# Patient Record
Sex: Female | Born: 1985 | Race: White | Hispanic: Yes | Marital: Single | Smoking: Never smoker
Health system: Southern US, Community
[De-identification: ages and names within clinical notes are randomized; demographics above are authoritative.]

## PROBLEM LIST (undated history)

## (undated) DIAGNOSIS — Z789 Other specified health status: Secondary | ICD-10-CM

## (undated) HISTORY — PX: HERNIA REPAIR: SHX51

---

## 2007-03-07 ENCOUNTER — Emergency Department (HOSPITAL_COMMUNITY): Admission: EM | Admit: 2007-03-07 | Discharge: 2007-03-07 | Payer: Self-pay | Admitting: Emergency Medicine

## 2008-06-23 ENCOUNTER — Ambulatory Visit (HOSPITAL_COMMUNITY): Admission: RE | Admit: 2008-06-23 | Discharge: 2008-06-23 | Payer: Self-pay | Admitting: Obstetrics & Gynecology

## 2008-07-21 ENCOUNTER — Ambulatory Visit: Payer: Self-pay | Admitting: Advanced Practice Midwife

## 2008-07-21 ENCOUNTER — Inpatient Hospital Stay (HOSPITAL_COMMUNITY): Admission: AD | Admit: 2008-07-21 | Discharge: 2008-07-23 | Payer: Self-pay | Admitting: Family Medicine

## 2009-01-02 IMAGING — CR DG ABDOMEN ACUTE W/ 1V CHEST
3 series · 3 of 3 positions shown · non-contrast
Comparison: None.

CLINICAL DATA: Fell ? abdominal pain.  
 ACUTE ABDOMEN SERIES WITH CHEST ? 3 VIEW:

[w chest pa]
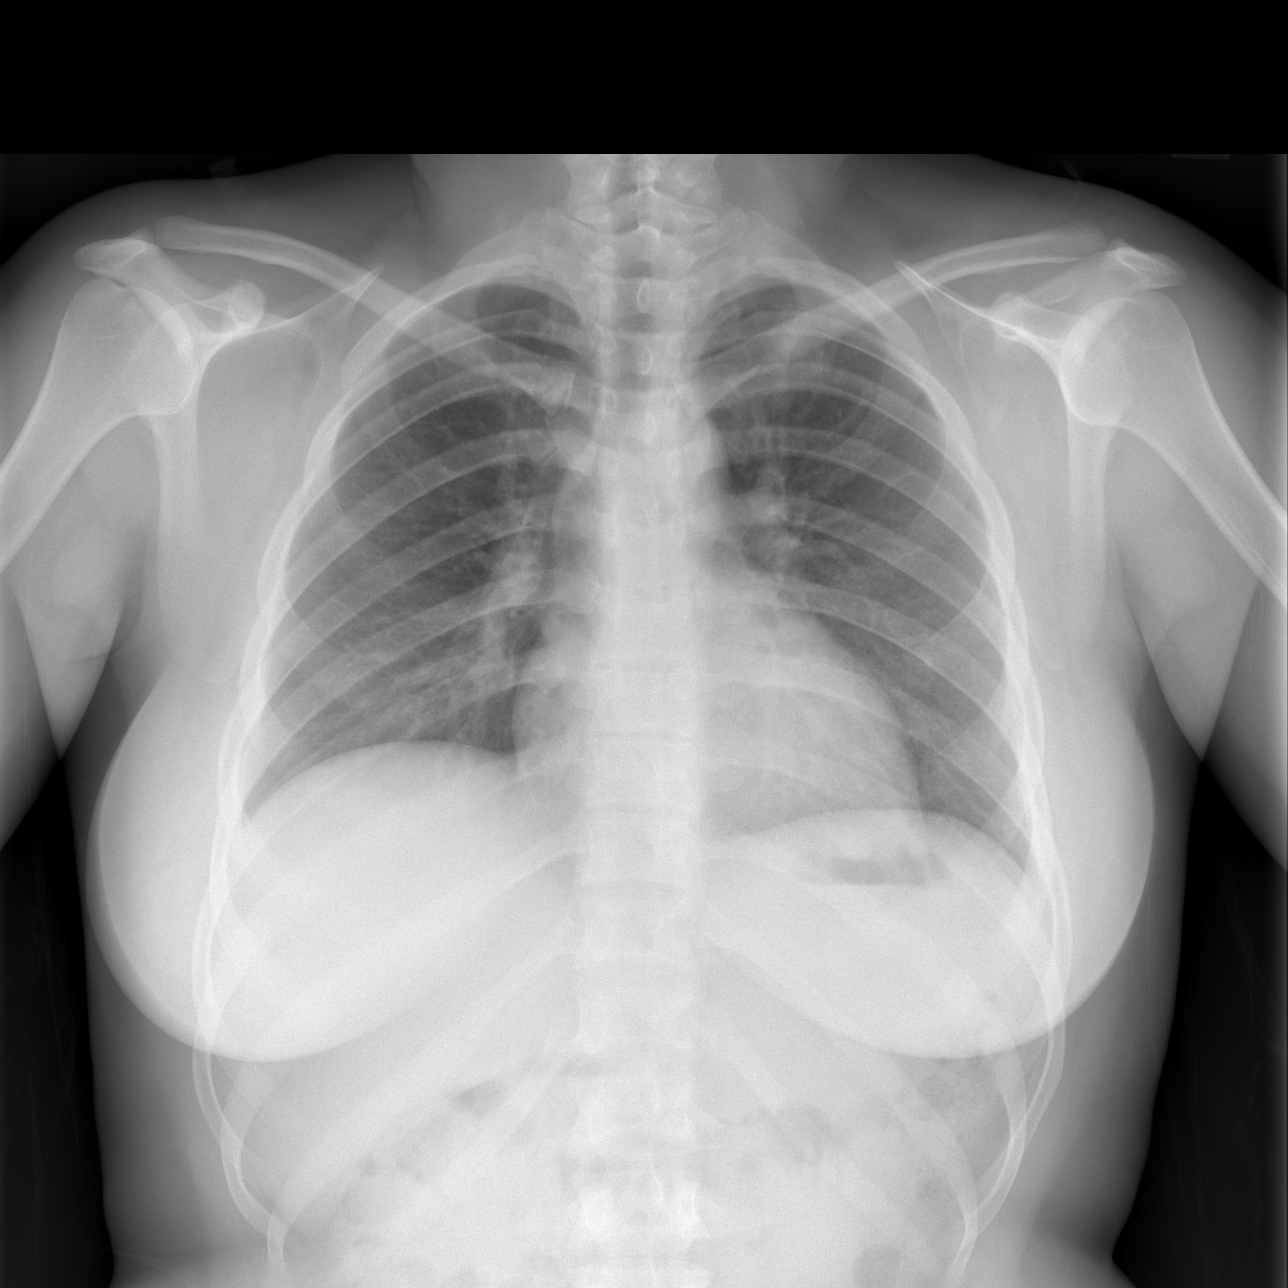

[w abdomen upright *]
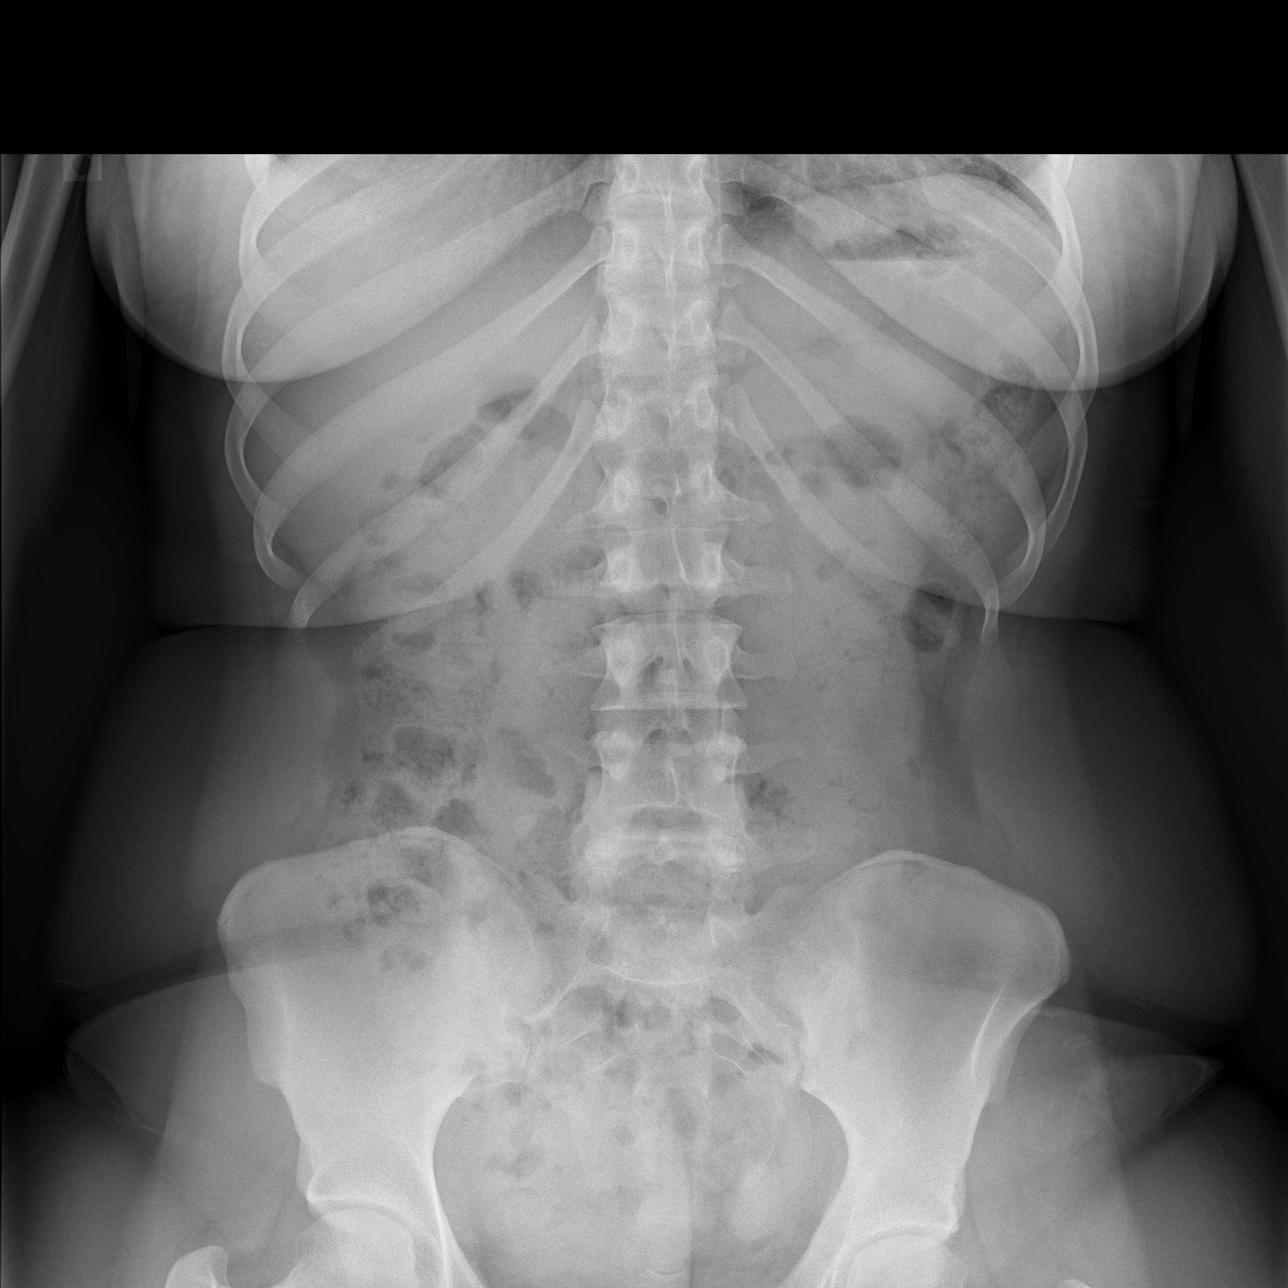

[t abdomen supine]
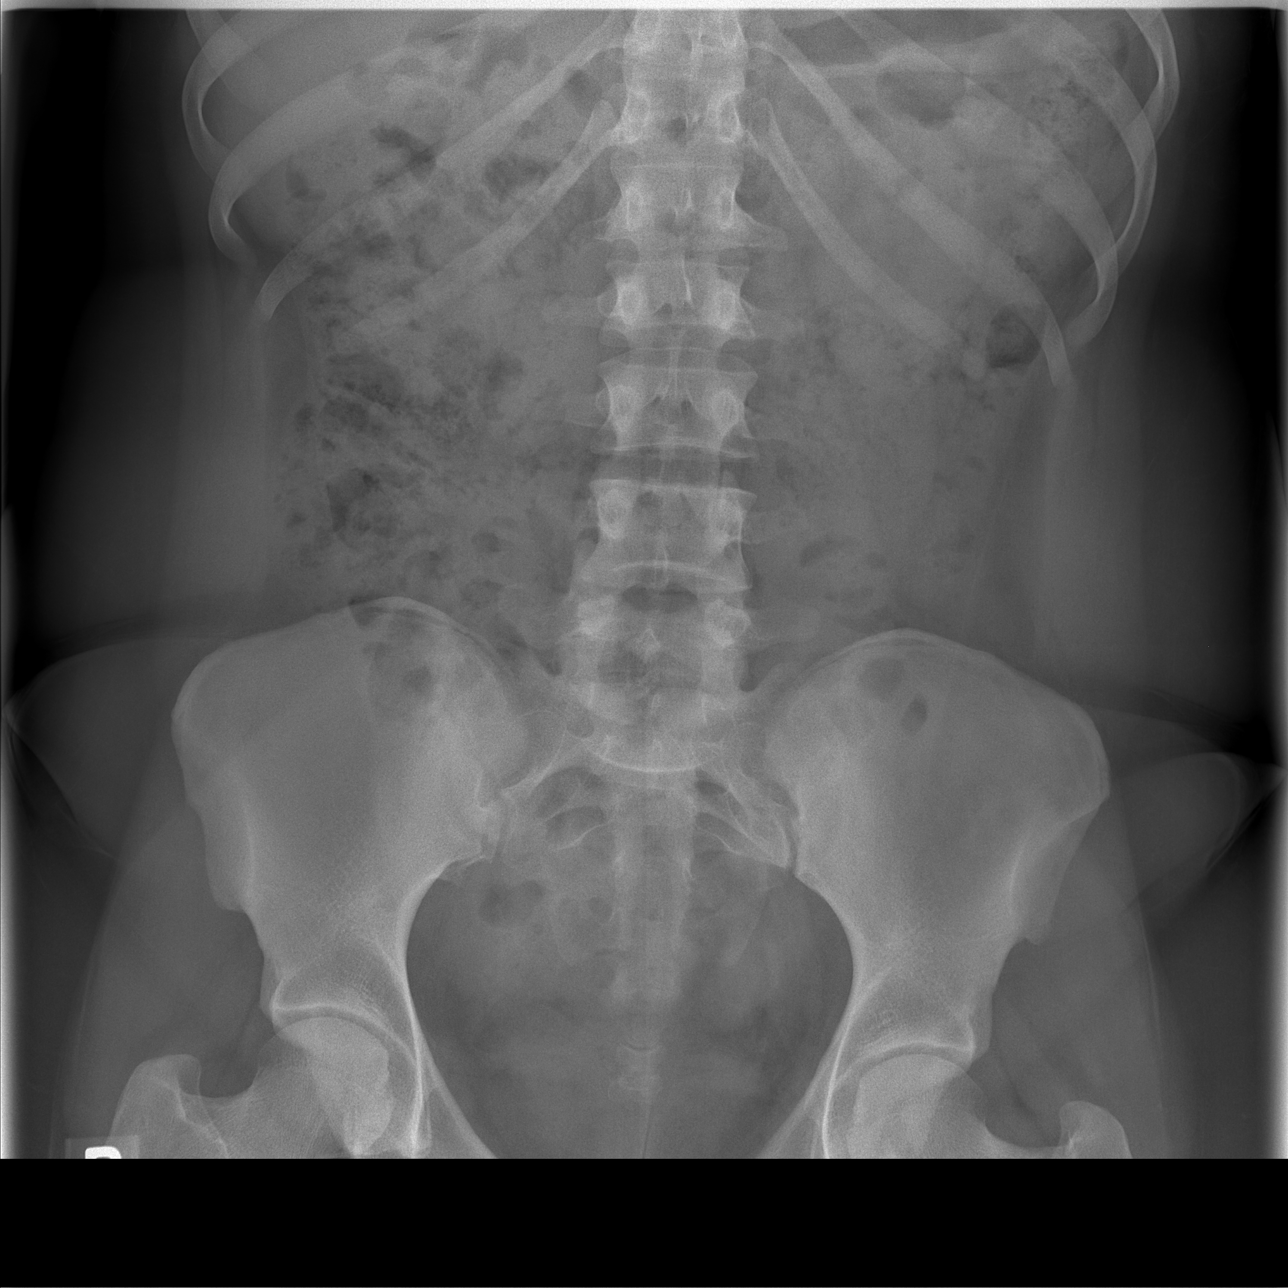

[3 of 3 positions shown; findings below may reference images not displayed]

FINDINGS: Relatively low level of inspiration but no active cardiopulmonary process in the AP projection.  
 No free air or acute/specific abnormality of the bowel gas pattern.  Psoas margins intact.  Bony structures are unremarkable.
IMPRESSION: No acute or significant findings.

## 2010-06-28 LAB — CBC
HCT: 37.4 % (ref 36.0–46.0)
Hemoglobin: 12.9 g/dL (ref 12.0–15.0)
MCV: 92.1 fL (ref 78.0–100.0)
Platelets: 204 10*3/uL (ref 150–400)
RDW: 12.5 % (ref 11.5–15.5)

## 2010-12-23 LAB — DIFFERENTIAL
Basophils Absolute: 0
Eosinophils Absolute: 0.3
Eosinophils Relative: 4
Lymphocytes Relative: 44
Lymphs Abs: 2.7
Monocytes Relative: 9
Neutro Abs: 2.6
Neutrophils Relative %: 42 — ABNORMAL LOW

## 2010-12-23 LAB — COMPREHENSIVE METABOLIC PANEL
Albumin: 3.8
Alkaline Phosphatase: 66
CO2: 26
Calcium: 8.9
Chloride: 105
Creatinine, Ser: 0.76
GFR calc non Af Amer: >60
Glucose, Bld: 89
Potassium: 3.7
Total Bilirubin: 0.8
Total Protein: 6.5

## 2010-12-23 LAB — CBC
MCV: 87.1
Platelets: 216
RDW: 12.4

## 2020-12-21 ENCOUNTER — Other Ambulatory Visit: Payer: Self-pay

## 2020-12-21 ENCOUNTER — Inpatient Hospital Stay (HOSPITAL_COMMUNITY)
Admission: AD | Admit: 2020-12-21 | Discharge: 2020-12-21 | Disposition: A | Discharge status: Home / Self Care | Payer: Self-pay | Attending: Obstetrics and Gynecology | Admitting: Obstetrics and Gynecology

## 2020-12-21 DIAGNOSIS — Z3202 Encounter for pregnancy test, result negative: Secondary | ICD-10-CM

## 2020-12-21 DIAGNOSIS — R1032 Left lower quadrant pain: Secondary | ICD-10-CM | POA: Insufficient documentation

## 2020-12-21 DIAGNOSIS — N939 Abnormal uterine and vaginal bleeding, unspecified: Secondary | ICD-10-CM | POA: Insufficient documentation

## 2020-12-21 LAB — POCT PREGNANCY, URINE: Preg Test, Ur: NEGATIVE

## 2020-12-21 NOTE — MAU Note (Signed)
Presents c/o VB, states bleeding was initially light and is now heavier like a menstrual cycle.  Reports VB began on Thursday and worsened today.  Also reports cramping in left lower quadrant.  LMP 11/14/2020.  +HPT.

## 2020-12-21 NOTE — MAU Provider Note (Signed)
Event Date/Time  First Provider Initiated Contact with Patient 12/21/20 1751     S [name] Meg Niemeier is a 35 y.o. [phone] patient who presents to MAU today with complaint of worsening vaginal bleeding 2/2 two positive home pregnancy tests. Patient states her bleeding began as light spotting last Thursday 12/16/2020. Over time her bleeding has intensified and now seems more like her menstrual cycle. She also c/o LLQ "crampy" pain. She is not saturating a pad. She is not experiencing associated symptoms like weakness, dizziness, syncope.  O BP 121/69 (BP Location: Right Arm)   Pulse 78   Temp 98.2 F (36.8 C) (Oral)   Resp 20   Wt 92.9 kg   LMP 11/14/2020   SpO2 100%    Physical Exam Vitals and nursing note reviewed. Exam conducted with a chaperone present.  Constitutional:      Appearance: Normal appearance.  Cardiovascular:     Rate and Rhythm: Normal rate.     Pulses: Normal pulses.  Pulmonary:     Effort: Pulmonary effort is normal.  Abdominal:     General: Abdomen is flat.  Neurological:     Mental Status: She is alert and oriented to person, place, and time.  Psychiatric:        Mood and Affect: Mood normal.        Behavior: Behavior normal.        Thought Content: Thought content normal.        Judgment: Judgment normal.   A Medical screening exam complete Negative urine pregnancy test in MAU Pt declined offer of Quant hCG due to her work schedule Reassured that given symptoms and positive HPT she can return to MAU overnight or tomorrow for Quant hCG  P Discharge from MAU in stable condition Bleeding precautions reviewed Patient may return to MAU as needed   Language barrier: in-person spanish interpreter Tim Lair present for all patient interaction  Mallie Snooks, [place] 12/21/2020 6:27 PM

## 2021-03-20 NOTE — L&D Delivery Note (Addendum)
Obstetrical Delivery Note   Date of Delivery:   11/06/2021 Primary OB:   [facility] Department Gestational Age/EDD: 26w1dReason for Admission: Preterm labor Antepartum complications: preterm advanced cervical dilation. Accessory lobe of the placenta  Delivered By:   CDurene RomansMd  Delivery Type:   spontaneous vaginal delivery  Delivery Details:   Called to see patient for feeling lots of pressure and patient felt complete. SVE done and pt complete and +3. NICU team called and patient had an uncomplicated delivery.    Placenta cord starting to avulse with delivery of the placenta. So manual extraction of the placenta done. Placenta removed in one piece and inspected and weak area was at the fibroid area, which the fibroid felt to be submucosal, near the fundus and to be about 10cm. I didn't feel any placenta left at the fundus but it was hard to get my hand all the way to the fundus due to the obstructing fibroid but I didn't feel anything on circumferential sweep with my fingers. Bleeding within normal limits. IV cefoxitin x 1 ordered due to manual extraction. Perineum inspected and no lacerations Anesthesia:    epidural Intrapartum complications: Preterm Labor GBS:    Negative but patient was still receiving PCN due to being preterm.  Laceration:    none Episiotomy:    none Rectal exam:   deferred Placenta:    Delivered and expressed via active management. Intact: yes. To pathology: yes.  Delayed Cord Clamping: Yes for 30 seconds.  Estimated Blood Loss:  5075m Baby:    Liveborn female, APGARs 7/9, weight 1390gm Cord gases Arterial pH 7.37, cO2 46, bicarb 23.4 Venous pH 7.42, cO2 36, bicard 26.6   I told her consideration should be done about removal of fibroid prior to next pregnancy  iPad interpreter present for delivery  ChDurene RomansD Attending [facility]

## 2021-08-25 LAB — HEPATITIS C ANTIBODY: HCV Ab: NEGATIVE

## 2021-08-25 LAB — OB RESULTS CONSOLE HIV ANTIBODY (ROUTINE TESTING): HIV: NONREACTIVE

## 2021-08-25 LAB — OB RESULTS CONSOLE HGB/HCT, BLOOD
HCT: 38 (ref 29–41)
Hemoglobin: 12.1

## 2021-08-25 LAB — OB RESULTS CONSOLE GC/CHLAMYDIA
Chlamydia: NEGATIVE
Neisseria Gonorrhea: NEGATIVE

## 2021-08-25 LAB — OB RESULTS CONSOLE HEPATITIS B SURFACE ANTIGEN: Hepatitis B Surface Ag: NEGATIVE

## 2021-08-25 LAB — OB RESULTS CONSOLE RPR: RPR: NONREACTIVE

## 2021-08-25 LAB — OB RESULTS CONSOLE RUBELLA ANTIBODY, IGM: Rubella: IMMUNE

## 2021-08-25 LAB — OB RESULTS CONSOLE VARICELLA ZOSTER ANTIBODY, IGG: Varicella: IMMUNE

## 2021-08-25 LAB — OB RESULTS CONSOLE PLATELET COUNT: Platelets: 231

## 2021-08-30 ENCOUNTER — Ambulatory Visit: Payer: Self-pay

## 2021-08-30 ENCOUNTER — Telehealth: Payer: Self-pay

## 2021-09-06 ENCOUNTER — Ambulatory Visit: Payer: Self-pay

## 2021-09-06 ENCOUNTER — Ambulatory Visit: Payer: Self-pay | Attending: Obstetrics and Gynecology | Admitting: Obstetrics and Gynecology

## 2021-09-06 DIAGNOSIS — Z8489 Family history of other specified conditions: Secondary | ICD-10-CM

## 2021-09-06 DIAGNOSIS — O09522 Supervision of elderly multigravida, second trimester: Secondary | ICD-10-CM

## 2021-09-06 DIAGNOSIS — Z3A21 21 weeks gestation of pregnancy: Secondary | ICD-10-CM

## 2021-09-06 NOTE — Progress Notes (Signed)
I connected with  Mary Levine on 09/06/21 by telephone and verified that I am speaking with the correct person using two identifiers.   I discussed the limitations of evaluation and management by telemedicine. The patient expressed understanding and agreed to proceed.   Referring Provider:   [facility] Department Length of Consultation: 60 minutes  [name] was referred to Maternal Fetal Care at [facility] for genetic counseling because of advanced maternal age.  The patient will be 36 years old at the time of delivery.  This note summarizes the information we discussed with the patient alone and with the aid of a Spanish interpreter.  The patient was counseled by myself and Mary Levine, genetic counseling intern.    We explained that the chance of a chromosome abnormality increases with maternal age.  Chromosomes and examples of chromosome problems were reviewed.  Humans typically have 46 chromosomes in each cell, with half passed through each sperm and egg.  Any change in the number or structure of chromosomes can increase the risk of problems in the physical and mental development of a pregnancy.   Based upon age of the patient and the current gestational age, the chance of any chromosome abnormality was 1 in 5. The chance of Down syndrome, the most common chromosome problem associated with maternal age, was 1 in 65.  The risk of chromosome problems is in addition to the 3% general population risk for birth defects and intellectual disabilities.  The greatest chance, of course, is that the baby would be born in good health.  We discussed the following prenatal screening and testing options for this pregnancy:  Cell free fetal DNA testing analyzes maternal blood to determine whether or not the baby may have Down syndrome, trisomy 56, or trisomy 46.  This test utilizes a maternal blood sample and DNA sequencing technology to isolate circulating cell free  fetal DNA from maternal plasma.  The fetal DNA can then be analyzed for DNA sequences that are derived from the three most common chromosomes involved in aneuploidy, chromosomes 13, 18, and 21.  If the overall amount of DNA is greater than the expected level for any of these chromosomes, aneuploidy is suspected.  While we do not consider it a replacement for invasive testing and karyotype analysis, a negative result from this testing would be reassuring, though not a guarantee of a normal chromosome complement for the baby.  An abnormal result is certainly suggestive of an abnormal chromosome complement, though we would still recommend CVS or amniocentesis to confirm any findings from this testing.  [name] had prior cell free fetal DNA testing in this pregnancy which was negative for Trisomy 13, 18 and 21.  Results were also low risk for sex chromosome aneuploidies, Triploidy and 22q11.2 deletion.  This result significantly reduces the chance for these conditions in the pregnancy, but is still considered a screening test.   Maternal serum marker screening, a blood test that measures pregnancy proteins, can provide risk assessments for Down syndrome, trisomy 18, and open neural tube defects (spina bifida, anencephaly). Because it does not directly examine the fetus, it cannot positively diagnose or rule out these problems. The detection rate is approximately 75% for Down syndrome, 70% for trisomy 18 and 80% of open neural tube defects. If prior chromosome screening has been performed, then AFP only is recommended to test for open neural tube defects alone.  Targeted ultrasound uses high frequency sound waves to create an  image of the developing fetus.  An ultrasound is often recommended as a routine means of evaluating the pregnancy.  It is also used to screen for fetal anatomy problems (for example, a heart defect) that might be suggestive of a chromosomal or other abnormality.   Amniocentesis involves  the removal of a small amount of amniotic fluid from the sac surrounding the fetus with the use of a thin needle inserted through the maternal abdomen and uterus.  Ultrasound guidance is used throughout the procedure.  Fetal cells from amniotic fluid are directly evaluated and > 99.5% of chromosome problems and > 98% of open neural tube defects can be detected. This procedure is generally performed after the 15th week of pregnancy.  The main risks to this procedure include complications leading to miscarriage in less than 1 in 200 cases (0.5%).  Prior carrier screening with the Horizon Basic panel was also negative.  This included screening for alpha thalassemia, beta hemoglobinopathies, cystic fibrosis and spinal muscular atrophy.  This negative testing greatly reduces, but cannot eliminate, the chance to have a child with any of these conditions.  See the lab report for details and residual risks.  We obtained a detailed family history and pregnancy history.   [name] is 35w2dand [phone] She reported some mild cramping. She reported bleeding (spotting) one time last week. [name] been experiencing vaginal discharge [facility] We recommended that she follow up with her provider since it is not improving. [name] taking low dose Aspirin daily as directed by her OB. She denied exposure to alcohol, cigarettes, and recreational drugs.  [name] a 110y.o. daughter from a previous relationship, and 36y.o. and 128y.o. daughters from another relationship. Her current pregnancy is the first with this partner (Mary Levine. She reports a potential very early miscarriage based on a home pregnancy test that was followed by bleeding; this was not clinically confirmed. She explained that her 190y.o. daughter had "blood clots" in her brain at 4 m.o. that required surgery. Doctors attributed the blood clots to a fall. Mrs. RKiki Bivenssaid that when her daughter fell, she does not believe that she hit her head. She reports that her daughter also had a large head (no hydrocephaly) and numerous fevers in childhood, with no seizures or developmental delay. We explained that there can be many genetic reasons for blood clots; without there being a known genetic cause, and with the history of potential trauma, it is difficult to determine risk for the current pregnancy. Of note, Ms. RMikal Levine's father had "blood clots" that were known to be secondary to a fall but that there is no other family history suggestive of a clotting disorder. The patient stated that she would try to bring medical records on her daughter's diagnosis and treatment to her next visit for our review.  Ms. RMikal Levine's 180y.o. daughter has eczema and learning challenges. We explained that learning differences are common and can have genetic or non-genetic causes. If an underlying genetic condition has been identified, there is often a known recurrence risk.  However, the absence of a known cause, most cases are expected to be multifactorial. The patient stated that this daughter had an evaluation for her learning delays, which she will try to bring to her next visit and we are happy to review. Ms. RMikal Levine's partner, ORica Levine has a nephew who is disabled  who she reported may have autism or Down syndrome. We would need more information to be able to assess the recurrence risk for the current pregnancy.  The remainder of the family history is unremarkable for birth defects, developmental delays, recurrent pregnancy loss or known chromosome abnormalities.  [name] was encouraged to call with questions or concerns.  We can be contacted at [phone].  Plan of Care: The patient declined amniocentesis and would like to have ultrasound only.  She is scheduled for her anatomy ultrasound at [facility] on 09/08/21. msAFP only to be drawn by OB if desired for spina  bifida screening. [name] stated that she would bring medical records from her daughters evaluations with her to the ultrasound visit on 09/08/21.  We have asked [facility] to forward those to our clinic for review if they are available.   Wilburt Finlay, MS, CGC

## 2021-09-22 ENCOUNTER — Telehealth: Payer: Self-pay | Admitting: Obstetrics and Gynecology

## 2021-09-22 NOTE — Telephone Encounter (Signed)
Phone call to Ms. Dejana Pugsley to review the medical records which she provided in follow up to her genetic counseling visit on 09/06/2021.  After multiple attempts, I was not able to reach the patient and her voicemail is full. The following is what we learned from a review of those records:  The patient had provided copies of medical records on two of her daughters in follow up to her genetic counseling visit.  This was in an effort to determine any possible genetic test results or diagnoses which may be helpful in providing recurrence risk assessments for the current pregnancy.  The records provided the following information:  Ignacia Bayley (dob 06/25/2010) was seen in 2012 at [facility] at 19 months old.  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  She also had ongoing fever at that time which was attributed to the hematoma and inflammation.  Additional imaging and testing, which included genetic testing (for glutaric aciduria and Menkes disease) as well as hematology evaluation (including factor VIII, vWF and hemoglobinopathy evaluation), were normal.  No underlying reason was found for the hematomas. Geraldo Pitter is now 36 years old and has no developmental concerns, other bleeding or clotting episodes or health problems.  The cause for her brain bleeding was attributed to a fall and no other medical cause was found to suggest an increased risk for bleeding or clotting in other family members.  Jocelyn Kathryne Gin was seen at [facility] in August of [birth year] at 36 years of age for evaluation of cognitive and emotional functioning. 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  The records also discuss emotional factors involved in her development.   While Verdie Drown has had a thorough evaluation, there is no mention of genetic testing to determine if there may be an underlying inherited cause for her condition. In the absence of a known cause, it is difficult to accurately provide a risk estimate for this pregnancy to have similar developmental differences. We could consider the option of carrier screening on Ms. Kambree Krauss for Fragile X Syndrome, an inherited condition which results in developmental delays, though it is important to remember that this is only one of many causes for developmental differences.  The records did not provide any specific diagnoses for which we would offer prenatal diagnosis or other carrier screening in this pregnancy.  We are happy to review records again in the future if additional information arises.  We may be reached at [phone] or [phone].  Wilburt Finlay, MS, CGC

## 2021-10-25 ENCOUNTER — Other Ambulatory Visit: Payer: Self-pay

## 2021-10-25 ENCOUNTER — Inpatient Hospital Stay (HOSPITAL_COMMUNITY)
Admission: AD | Admit: 2021-10-25 | Discharge: 2021-10-28 | DRG: 833 | Disposition: A | Discharge status: Home / Self Care | Payer: Medicaid Other | Attending: Obstetrics and Gynecology | Admitting: Obstetrics and Gynecology

## 2021-10-25 ENCOUNTER — Encounter (HOSPITAL_COMMUNITY): Payer: Self-pay | Admitting: *Deleted

## 2021-10-25 DIAGNOSIS — O3413 Maternal care for benign tumor of corpus uteri, third trimester: Secondary | ICD-10-CM | POA: Diagnosis present

## 2021-10-25 DIAGNOSIS — Z3A28 28 weeks gestation of pregnancy: Secondary | ICD-10-CM | POA: Diagnosis not present

## 2021-10-25 DIAGNOSIS — D259 Leiomyoma of uterus, unspecified: Secondary | ICD-10-CM | POA: Diagnosis present

## 2021-10-25 DIAGNOSIS — O09523 Supervision of elderly multigravida, third trimester: Secondary | ICD-10-CM

## 2021-10-25 DIAGNOSIS — O4700 False labor before 37 completed weeks of gestation, unspecified trimester: Principal | ICD-10-CM | POA: Diagnosis present

## 2021-10-25 LAB — TYPE AND SCREEN
ABO/RH(D): O POS
Antibody Screen: NEGATIVE

## 2021-10-25 LAB — URINALYSIS, ROUTINE W REFLEX MICROSCOPIC
Bacteria, UA: NONE SEEN
Bilirubin Urine: NEGATIVE
Glucose, UA: NEGATIVE mg/dL
Ketones, ur: 5 mg/dL — AB
Nitrite: NEGATIVE
Protein, ur: NEGATIVE mg/dL
Specific Gravity, Urine: 1.001 — ABNORMAL LOW (ref 1.005–1.030)
pH: 7 (ref 5.0–8.0)

## 2021-10-25 LAB — WET PREP, GENITAL
Clue Cells Wet Prep HPF POC: NONE SEEN
Sperm: NONE SEEN
Trich, Wet Prep: NONE SEEN
WBC, Wet Prep HPF POC: >=10 — AB (ref <10)
Yeast Wet Prep HPF POC: NONE SEEN

## 2021-10-25 LAB — CBC
HCT: 38.1 % (ref 36.0–46.0)
Hemoglobin: 13.1 g/dL (ref 12.0–15.0)
MCH: 30.3 pg (ref 26.0–34.0)
MCHC: 34.4 g/dL (ref 30.0–36.0)
MCV: 88.2 fL (ref 80.0–100.0)
Platelets: 254 10*3/uL (ref 150–400)
RBC: 4.32 MIL/uL (ref 3.87–5.11)
RDW: 12.5 % (ref 11.5–15.5)
WBC: 10 10*3/uL (ref 4.0–10.5)
nRBC: 0 % (ref 0.0–0.2)

## 2021-10-25 MED ORDER — ACETAMINOPHEN 325 MG PO TABS
650.0000 mg | ORAL_TABLET | Freq: Once | ORAL | Status: AC
  Administered 2021-10-25: 650 mg via ORAL

## 2021-10-25 MED ORDER — PRENATAL MULTIVITAMIN CH
1.0000 | ORAL_TABLET | Freq: Every day | ORAL | Status: DC
  Administered 2021-10-27: 1 via ORAL
  Filled 2021-10-25 (×2): qty 1

## 2021-10-25 MED ORDER — OXYTOCIN-SODIUM CHLORIDE 30-0.9 UT/500ML-% IV SOLN: 2.5000 [IU]/h | INTRAVENOUS | Status: DC

## 2021-10-25 MED ORDER — ACETAMINOPHEN 325 MG PO TABS
ORAL_TABLET | ORAL | Status: AC
  Filled 2021-10-25: qty 2

## 2021-10-25 MED ORDER — ZOLPIDEM TARTRATE 5 MG PO TABS: 5.0000 mg | ORAL_TABLET | Freq: Every evening | ORAL | Status: DC | PRN

## 2021-10-25 MED ORDER — LIDOCAINE HCL (PF) 1 % IJ SOLN: 30.0000 mL | INTRAMUSCULAR | Status: DC | PRN

## 2021-10-25 MED ORDER — MAGNESIUM SULFATE BOLUS VIA INFUSION
4.0000 g | Freq: Once | INTRAVENOUS | Status: AC
Start: 2021-10-25 — End: 2021-10-25
  Administered 2021-10-25: 4 g via INTRAVENOUS
  Filled 2021-10-25: qty 1000

## 2021-10-25 MED ORDER — ACETAMINOPHEN 325 MG PO TABS
650.0000 mg | ORAL_TABLET | ORAL | Status: DC | PRN
  Administered 2021-10-26: 650 mg via ORAL
  Filled 2021-10-25: qty 2

## 2021-10-25 MED ORDER — CALCIUM CARBONATE ANTACID 500 MG PO CHEW: 2.0000 | CHEWABLE_TABLET | ORAL | Status: DC | PRN

## 2021-10-25 MED ORDER — MAGNESIUM SULFATE 40 GM/1000ML IV SOLN
2.0000 g/h | INTRAVENOUS | Status: DC
  Administered 2021-10-26: 2 g/h via INTRAVENOUS
  Filled 2021-10-25 (×2): qty 1000

## 2021-10-25 MED ORDER — BETAMETHASONE SOD PHOS & ACET 6 (3-3) MG/ML IJ SUSP: 12.0000 mg | Freq: Once | INTRAMUSCULAR | Status: DC

## 2021-10-25 MED ORDER — LACTATED RINGERS IV BOLUS
1000.0000 mL | Freq: Once | INTRAVENOUS | Status: AC
  Administered 2021-10-25: 1000 mL via INTRAVENOUS

## 2021-10-25 MED ORDER — SOD CITRATE-CITRIC ACID 500-334 MG/5ML PO SOLN: 30.0000 mL | ORAL | Status: DC | PRN

## 2021-10-25 MED ORDER — LACTATED RINGERS IV SOLN: INTRAVENOUS | Status: DC

## 2021-10-25 MED ORDER — INDOMETHACIN 25 MG PO CAPS
100.0000 mg | ORAL_CAPSULE | ORAL | Status: AC
  Administered 2021-10-25: 100 mg via ORAL
  Filled 2021-10-25: qty 4

## 2021-10-25 MED ORDER — BETAMETHASONE SOD PHOS & ACET 6 (3-3) MG/ML IJ SUSP
12.0000 mg | INTRAMUSCULAR | Status: AC
  Administered 2021-10-25 – 2021-10-26 (×2): 12 mg via INTRAMUSCULAR
  Filled 2021-10-25 (×2): qty 5

## 2021-10-25 MED ORDER — INDOMETHACIN 25 MG PO CAPS
25.0000 mg | ORAL_CAPSULE | Freq: Four times a day (QID) | ORAL | Status: DC
  Administered 2021-10-25 – 2021-10-28 (×10): 25 mg via ORAL
  Filled 2021-10-25 (×14): qty 1

## 2021-10-25 MED ORDER — DOCUSATE SODIUM 100 MG PO CAPS
100.0000 mg | ORAL_CAPSULE | Freq: Every day | ORAL | Status: DC
Start: 2021-10-25 — End: 2021-10-28
  Administered 2021-10-27 – 2021-10-28 (×2): 100 mg via ORAL
  Filled 2021-10-25 (×2): qty 1

## 2021-10-25 MED ORDER — NIFEDIPINE 10 MG PO CAPS
10.0000 mg | ORAL_CAPSULE | ORAL | Status: DC | PRN
  Administered 2021-10-25: 10 mg via ORAL
  Filled 2021-10-25 (×2): qty 1

## 2021-10-25 MED ORDER — PENICILLIN G POT IN DEXTROSE 60000 UNIT/ML IV SOLN
3.0000 10*6.[IU] | INTRAVENOUS | Status: DC
  Administered 2021-10-26 (×4): 3 10*6.[IU] via INTRAVENOUS
  Filled 2021-10-25 (×9): qty 50

## 2021-10-25 MED ORDER — ONDANSETRON HCL 4 MG/2ML IJ SOLN: 4.0000 mg | Freq: Four times a day (QID) | INTRAMUSCULAR | Status: DC | PRN

## 2021-10-25 MED ORDER — OXYTOCIN BOLUS FROM INFUSION: 333.0000 mL | Freq: Once | INTRAVENOUS | Status: DC

## 2021-10-25 MED ORDER — SODIUM CHLORIDE 0.9 % IV SOLN
5.0000 10*6.[IU] | Freq: Once | INTRAVENOUS | Status: AC
  Administered 2021-10-25: 5 10*6.[IU] via INTRAVENOUS
  Filled 2021-10-25: qty 5

## 2021-10-25 MED ORDER — FENTANYL CITRATE (PF) 100 MCG/2ML IJ SOLN
50.0000 ug | INTRAMUSCULAR | Status: DC | PRN
  Administered 2021-10-25: 100 ug via INTRAVENOUS
  Filled 2021-10-25: qty 2

## 2021-10-25 NOTE — H&P (Signed)
Mary Levine is a 36 y.o. female [phone] with IUP at 25w3dby certain LMP  presenting for contractions. Patient endorses that she started having contractions "all of a sudden" starting last night that continued into today. She denies any history of pre-term labor.  She denies any recent illness. She denies any c/section in the past.  She reports positive fetal movement. She denies leakage of fluid or vaginal bleeding.  Pren[facility] History/Complications[facility] She passed her 2 hour GTT. Pregnancy has been complicated by anterior placenta with fundal fibroid measuring 9.8 cm; per MFM, no further work-up necessary.   Pregnancy complications:  - Past Medical History: History reviewed. No pertinent past medical history.  Past Surgical History: History reviewed. No pertinent surgical history.  Obstetrical History: OB History     Gravida  4   Para  3   Term  3   Preterm      AB      Living  3      SAB      IAB      Ectopic      Multiple      Live Births  3            Social History: Social History   Socioeconomic History   Marital status: Single    Spouse name: Not on file   Number of children: Not on file   Years of education: Not on file   Highest education level: Not on file  Occupational History   Not on file  Tobacco Use   Smoking status: Never   Smokeless tobacco: Never  Vaping Use   Vaping Use: Never used  Substance and Sexual Activity   Alcohol use: Never   Drug use: Never   Sexual activity: Yes  Other Topics Concern   Not on file  Social History Narrative   Not on file   Social Determinants of Health   Financial Resource Strain: Not on file  Food Insecurity: Not on file  Transportation Needs: Not on file  Physical Activity: Not on file  Stress: Not on file  Social Connections: Not on file    Family History: History reviewed. No pertinent family history.  Allergies: No Known Allergies  Medications Prior to Admission   Medication Sig Dispense Refill Last Dose   aspirin EC 81 MG tablet Take 81 mg by mouth daily. Swallow whole.   10/24/2021    Review of Systems   Constitutional: Negative for fever and chills Eyes: Negative for visual disturbances Respiratory: Negative for shortness of breath, dyspnea Cardiovascular: Negative for chest pain or palpitations  Gastrointestinal: Negative for vomiting, diarrhea and constipation.  POSITIVE for abdominal pain (contractions) Genitourinary: Negative for dysuria and urgency Musculoskeletal: Negative for back pain, joint pain, myalgias  Neurological: Negative for dizziness and headaches  Blood pressure 112/73, pulse 89, temperature 98.9 F (37.2 C), temperature source Oral, resp. rate 20, last menstrual period 11/14/2020, SpO2 99 %. General appearance: alert, cooperative, fatigued, flushed, and moderate distress Lungs: normal respiratory effort Heart: regular rate and rhythm Abdomen: soft, non-tender; bowel sounds normal Extremities: Homans sign is negative, no sign of DVT DTR's 2+ Presentation: cephalic Fetal monitoring   135 bpm, mod var, present acel, no decels, patient feels strong contractions that are occasionally picked up on toco Uterine activity   Dilation: 4 Exam by:: K.Jarrell Armond CNM   Prenatal labs: ABO, Rh: --/--/O POS (08/08 1712) Antibody: NEG (08/08 1712) Rubella:   RPR:    HBsAg:  HIV:    GBS:    1 hr Glucola: 117 Genetic screening   Anatomy US normal  Prenatal Transfer Tool  Maternal Diabetes: No Genetic Screening: Normal per patient Maternal Ultrasounds/Referrals: Other: Fetal Ultrasounds or other Referrals:  Referred to Materal Fetal Medicine  Maternal Substance Abuse:  No Significant Maternal Medications:  None Significant Maternal Lab Results: None  Results for orders placed or performed during the hospital encounter of 10/25/21 (from the past 24 hour(s))  Type and screen [place]   Collection Time:  10/25/21  5:12 PM  Result Value Ref Range   ABO/RH(D) O POS    Antibody Screen NEG    Sample Expiration      10/28/2021,2359 Performed at [facility], [place] [address], [place], [place] [address]   CBC   Collection Time: 10/25/21  5:13 PM  Result Value Ref Range   WBC 10.0 4.0 - 10.5 K/uL   RBC 4.32 3.87 - 5.11 MIL/uL   Hemoglobin 13.1 12.0 - 15.0 g/dL   HCT 38.1 36.0 - 46.0 %   MCV 88.2 80.0 - 100.0 fL   MCH 30.3 26.0 - 34.0 pg   MCHC 34.4 30.0 - 36.0 g/dL   RDW 12.5 11.5 - 15.5 %   Platelets 254 150 - 400 K/uL   nRBC 0.0 0.0 - 0.2 %  Urinalysis, Routine w reflex microscopic Urine, Clean Catch   Collection Time: 10/25/21  6:48 PM  Result Value Ref Range   Color, Urine COLORLESS (A) YELLOW   APPearance CLEAR CLEAR   Specific Gravity, Urine 1.001 (L) 1.005 - 1.030   pH 7.0 5.0 - 8.0   Glucose, UA NEGATIVE NEGATIVE mg/dL   Hgb urine dipstick SMALL (A) NEGATIVE   Bilirubin Urine NEGATIVE NEGATIVE   Ketones, ur 5 (A) NEGATIVE mg/dL   Protein, ur NEGATIVE NEGATIVE mg/dL   Nitrite NEGATIVE NEGATIVE   Leukocytes,Ua TRACE (A) NEGATIVE   RBC / HPF 0-5 0 - 5 RBC/hpf   WBC, UA 0-5 0 - 5 WBC/hpf   Bacteria, UA NONE SEEN NONE SEEN   Squamous Epithelial / LPF 0-5 0 - 5  Wet prep, genital   Collection Time: 10/25/21  7:40 PM  Result Value Ref Range   Yeast Wet Prep HPF POC NONE SEEN NONE SEEN   Trich, Wet Prep NONE SEEN NONE SEEN   Clue Cells Wet Prep HPF POC NONE SEEN NONE SEEN   WBC, Wet Prep HPF POC >=10 (A) <10   Sperm NONE SEEN     Assessment: Mary Levine is a 36 y.o. [phone] with an IUP at 36w3dpresenting for concern for SOL and making cervical change while in MAU.   Plan: #Labor: expectant management #Pain:  Per request #FWB Cat 1 #ID: GBS: pending  #MOF:  bottle #MOC: BTL (does not have medicaid and has not paid upfront cost) #Circ: NA (female)   KStarr Lake8/10/2021, 8:47 PM

## 2021-10-25 NOTE — Progress Notes (Signed)
Checked in on patient and introduced myself.  She is doing well. She states that her contractions have spaced out and become much milder. She has no other concerns.  Pregnancy was uncomplicated. No previous history of preterm deliveries.  EFM:

## 2021-10-25 NOTE — MAU Note (Addendum)
Mary Levine is a 36 y.o. at 68w3dhere in MAU reporting: she began having ctxs last night, but upon waking this morning they have worsened.  Reports ctxs are now 5 minutes apart with increasing intensity.  Denies LOF or VB, has clear/white vaginal discharge.  Endorses +FM.  Onset of complaint: last night Pain score: 8 Vitals:   10/25/21 1733 10/25/21 1745  BP: (!) 104/52 (!) 101/46  Pulse: 81   Resp:    Temp:    SpO2:       FHT:see flowsheet Lab orders placed from triage:   None

## 2021-10-25 NOTE — MAU Provider Note (Signed)
Patient Mary Levine is a 36 y.o.  [phone]  At 12w3dhere with complaints of abdominal pain that started last night and continued into this morning. She denies VB, LOF, decreased fetal movements. She denies any complications in this pregnancy; [facility]     CSN: [id number] Arrival date and time: 10/25/21 1637   None     Chief Complaint  Patient presents with   Contractions   HPI Patient 36y.o. female presents with abdominal pain. Onset of symptoms was gradual starting  last night.  They have been   gradually worsening course since that time. The pain is located in the RLQ and in the LLQ without radiation. Patient describes the pain as cramping,  severe. Pain has been associated with nothing. She has had normal BM and normal urinary output today. . Patient denies change in bowel habits, constipation, diarrhea, dyspepsia, jaundice, nausea, and vomiting. Symptoms are aggravated by none. Symptoms improve with none. Past history includes none.  OB History     Gravida  4   Para  3   Term  3   Preterm      AB      Living  3      SAB      IAB      Ectopic      Multiple      Live Births  3           History reviewed. No pertinent past medical history.  History reviewed. No pertinent surgical history.  History reviewed. No pertinent family history.  Social History   Tobacco Use   Smoking status: Never   Smokeless tobacco: Never  Vaping Use   Vaping Use: Never used  Substance Use Topics   Alcohol use: Never   Drug use: Never    Allergies: No Known Allergies  Medications Prior to Admission  Medication Sig Dispense Refill Last Dose   aspirin EC 81 MG tablet Take 81 mg by mouth daily. Swallow whole.   10/24/2021    Review of Systems  Constitutional: Negative.   HENT: Negative.    Respiratory: Negative.    Gastrointestinal:  Positive for abdominal pain.  Genitourinary:  Negative for vaginal bleeding,  vaginal discharge and vaginal pain.  Neurological: Negative.   Hematological: Negative.   Psychiatric/Behavioral: Negative.     Physical Exam   Blood pressure 120/70, pulse 80, temperature 98.1 F (36.7 C), temperature source Oral, resp. rate 16, last menstrual period 11/14/2020, SpO2 99 %.  Physical Exam Constitutional:      Appearance: Normal appearance.  Cardiovascular:     Rate and Rhythm: Normal rate.  Pulmonary:     Effort: Pulmonary effort is normal.  Abdominal:     General: Abdomen is flat.  Musculoskeletal:        General: Normal range of motion.  Skin:    General: Skin is warm.     Capillary Refill: Capillary refill takes less than 2 seconds.  Neurological:     General: No focal deficit present.     Mental Status: She is alert.  Psychiatric:     Comments: distressed     MAU Course  Procedures  MDM -called into room by RN as patient seemed like she was in active labor; initial exam was 3 cm. Over time she has made change to 4 cm and cervix feels like it is softening and moving more anterior.  She did not respond  to 1 dose of procardia or indomethacin; after discussion with Dr. Damita Dunnings, decision was made to admit to L and D and observe.   Assessment and Plan  -Admit to L and D; Dr. Damita Dunnings to place orders.   Mary Levine Mary Levine 10/25/2021, 9:45 PM

## 2021-10-25 NOTE — Consult Note (Signed)
Neonatology Consult Note:  At the request of the patients obstetrician Dr. Damita Dunnings I met with Mary Levine who is a 36 y.o. [phone] presenting with preterm labor.  She denies any complications in this pregnancy and has been receiving her prenatal care at [facility].  She is being managed with BMZ 10/25/21, Mag for neuroprotection and Indomethacin for tocolysis.    Using a video interpreter we discussed morbidity/mortality at this gestional age, delivery room resuscitation, including intubation and surfactant in DR.  Discussed risk  of IVH with potential for motor / cognitive deficits, NEC (and benefits of MBM in reducing incidence of NEC.  Discussed NG / OG feeds. Discussed likely length of stay.  Thank you for allowing [place] to participate in her care.  Please call with questions.  Higinio Roger, DO  Neonatologist  The total length of face-to-face or floor / unit time for this encounter was 35 minutes.  Counseling and / or coordination of care was greater than fifty percent of the time.

## 2021-10-26 ENCOUNTER — Inpatient Hospital Stay (HOSPITAL_COMMUNITY): Payer: Medicaid Other

## 2021-10-26 LAB — RAPID URINE DRUG SCREEN, HOSP PERFORMED
Amphetamines: NOT DETECTED
Barbiturates: NOT DETECTED
Benzodiazepines: NOT DETECTED
Cocaine: NOT DETECTED
Opiates: NOT DETECTED
Tetrahydrocannabinol: NOT DETECTED

## 2021-10-26 LAB — RPR: RPR Ser Ql: NONREACTIVE

## 2021-10-26 LAB — GC/CHLAMYDIA PROBE AMP (~~LOC~~) NOT AT ARMC
Chlamydia: NEGATIVE
Comment: NEGATIVE
Comment: NORMAL
Neisseria Gonorrhea: NEGATIVE

## 2021-10-26 MED ORDER — LACTATED RINGERS IV SOLN: INTRAVENOUS | Status: DC

## 2021-10-26 NOTE — Progress Notes (Signed)
Patient seen.  No new concerns, sleeping comfortably.  Contractions have waned completely at this time, Cervix: 4cm / 50% / ballotable EFM: 140 bpm, moderate variability / +accels, no decels No contractions noted on tocometry  Ass: Preterm contractions, s/p tocolytics, 1 dose of Betamethasone, now on indomethacin and Mg sulf. Contractions have waned and no cervical change noted.  FWB: category 1  Plan: ct monitoring, discussed findings with patient. Anticipate admission till 2nd dose of BMX

## 2021-10-26 NOTE — Progress Notes (Signed)
Labor Progress Note Addysin M Laddie Naeem is a 36 y.o. G4P3003 at 57w4dpresented for threatened PTL.   S: Resting. Doing well. No concerns at this time.   O:  BP 109/65   Pulse 69   Temp 97.6 F (36.4 C) (Oral)   Resp 16   LMP 11/14/2020   SpO2 99%  EFM: 130 bpm/moderate/+accels, no decels  CVE: Dilation: 4 Effacement (%): 50 Station: Ballotable Exam by:: Ndulue, Chiagoziem   A&P: 36y.o. [id number] for threatened PTL.  #Labor: Without preterm contraction and cervical change s/p tocolytics, BMZ X1 (second dose today), Indomethacin and mg++ (for 48 hours). NICU consulted.  #Pain: Maternally managed #FWB: Cat I #GBS  collected Hx of 9 cm fibroid and succenturiate lobe -Portable [place]oday  Jevaun Strick Autry-Lott, DO 9:19 AM

## 2021-10-26 NOTE — Progress Notes (Addendum)
Progress Note Mary Levine is a 36 y.o. G4P3003 at 74w4dpresented for threatened PTL.   S: Resting. Doing well. No concerns at this time.   O:  BP 119/64   Pulse 79   Temp 97.7 F (36.5 C) (Oral)   Resp 16   Ht '5\' 2"'$  (1.575 m)   LMP 11/14/2020   SpO2 99%   BMI 37.46 kg/m  EFM: 135 bpm/moderate/+accels, no decels  CVE: Dilation: 3.5 Effacement (%): 50 Station: Ballotable Presentation: Vertex (bedside ultrasound) Exam by:: Autry-Lott, MD   A&P: 36y.o. [id number] for threatened PTL.  Cervical exam Unchanged. Without preterm contractions s/p tocolytics, BMZ X1 (second dose today '@5pm'$ ), Indomethacin (q6h) and mg++. NICU consulted. US[place]oday nml growth, known accessory placental lobe and fibroid visualized.  -D/c mag -Give 2nd dose of bmz -Tranfer to ante -Continous EFM  Vienna Folden Autry-Lott, DO 5:49 PM

## 2021-10-26 NOTE — Progress Notes (Addendum)
Labor Progress Note Mary Levine is a 36 y.o. G4P3003 at 85w4dpresented for threatened PTL.   S: Resting. Doing well. No concerns at this time.   O:  BP (!) 108/57   Pulse 79   Temp 97.6 F (36.4 C) (Oral)   Resp 18   LMP 11/14/2020   SpO2 99%  EFM: 135 bpm/moderate/+accels, no decels  CVE: Dilation: 4 Effacement (%): 50 Station: Ballotable Presentation: Vertex (bedside ultrasound) Exam by:: Ndulue, Chiagoziem   A&P: 36y.o. [id number] for threatened PTL.  #Labor: Without preterm contractions s/p tocolytics, BMZ X1 (second dose today '@5pm'$ ), Indomethacin (q6h) and mg++ (for 48 hours). NICU consulted. Plan to discuss moving back to ANTE during 5pm shift change.  #Pain: Maternally managed #FWB: Cat I #GBS  collected  Hx of 9 cm fibroid and succenturiate lobe -Portable US[place]ending  [name], DO 1:14 PM

## 2021-10-27 NOTE — Progress Notes (Signed)
Patient ID: Mary Levine, female   DOB: 11-Mar-1986, 36 y.o.   MRN: [id number] Mary Levine) NOTE  Mary Levine is a 36 y.o. [phone] with Estimated Date of Delivery: 01/14/22   By  early ultrasound [redacted]w[redacted]d who is admitted for Preterm labor.    Fetal presentation is cephalic. Length of Stay:  2  Days  Date of admission:10/25/2021  Subjective: No contractions Patient reports the fetal movement as active. Patient reports uterine contraction  activity as none. Patient reports  vaginal bleeding as none. Patient describes fluid per vagina as None.  Vitals:  Blood pressure (!) 126/58, pulse 70, temperature 98.5 F (36.9 C), temperature source Oral, resp. rate 16, height '5\' 2"'$  (1.575 m), last menstrual period 11/14/2020, SpO2 98 %. Vitals:   10/26/21 1951 10/26/21 2305 10/27/21 0403 10/27/21 0910  BP: 119/69 (!) 104/55 117/61 (!) 126/58  Pulse: 87 78 73 70  Resp: '18 16 18 16  '$ Temp: 98.5 F (36.9 C) 98.3 F (36.8 C) 98.4 F (36.9 C) 98.5 F (36.9 C)  TempSrc: Oral Oral Oral Oral  SpO2: 97% 98% 100% 98%  Height:       Physical Examination:  General appearance - alert, well appearing, and in no distress Abdomen - soft, nontender, nondistended, no masses or organomegaly Fundal Height:  size equals dates Pelvic Exam:  examination not indicated Cervical Exam: Not evaluated. .  Membranes:intact  Fetal Monitoring:  Baseline: 140 bpm, Variability: Good {> 6 bpm), Accelerations: Reactive, and Decelerations: Absent   reactive  Labs:  No results found for this or any previous visit (from the past 24 hour(s)).  Imaging Studies:    UK[place]MFM OB COMP + 14 WK  Result Date: 10/26/2021 ----------------------------------------------------------------------  OBSTETRICS REPORT                       (Signed Final 10/26/2021 05:06 pm) ---------------------------------------------------------------------- Patient Info  ID #:       [id number]                          D.O.B.:  020-Jul-1987(36 yrs)  Name:       Mary Levine             Visit Date: 10/26/2021 08:07 am ---------------------------------------------------------------------- Performed By  Attending:        CSander Nephew     Referred By:      [facility]                    MD                                       Suites  Performed By:     TNevin Bloodgood         [facility]                    [place] Orders  #  Description  Code        Ordered By  1  [place] MFM OB COMP + 36 WK                [id number]    PAULA DUNCAN ----------------------------------------------------------------------  #  Order #                     Accession #                Episode #  1  [id number]                   [id number]                 [id number] ---------------------------------------------------------------------- Indications  Preterm labor without delivery, unspecified    O60.00  trimester  Encounter for antenatal screening for          Z36.3  malformations  Advanced maternal age multigravida 67+,        O79.523  third trimester  Uterine fibroids affecting pregnancy in third  O34.13, D25.9  trimester, antepartum  [redacted] weeks gestation of pregnancy                Z3A.28 ---------------------------------------------------------------------- Fetal Evaluation  Num Of Fetuses:         1  Fetal Heart Rate(bpm):  132  Cardiac Activity:       Observed  Presentation:           Cephalic  Placenta:               Ant w/ post fund Succenturiate lobe  P. Cord Insertion:      Not well visualized  Amniotic Fluid  AFI FV:      Within normal limits  AFI Sum(cm)     %Tile       Largest Pocket(cm)  12.21           30          4.71  RUQ(cm)       RLQ(cm)       LUQ(cm)        LLQ(cm)  3.06          4.71          2.15           2.29 ----------------------------------------------------------------------  Biometry  BPD:     70.89  mm     G. Age:  28w 3d         39  %    CI:        73.94   %    70 - 86                                                          FL/HC:      20.7   %    18.8 - 20.6  HC:    261.83   mm     G. Age:  28w 3d         20  %    HC/AC:      1.13        1.05 - 1.21  AC:    231.17   mm     G. Age:  27w 3d  17  %    FL/BPD:     76.4   %    71 - 87  FL:      54.15  mm     G. Age:  28w 5d         41  %    FL/AC:      23.4   %    20 - 24  HUM:      49.6  mm     G. Age:  29w 1d         58  %  CER:      33.5  mm     G. Age:  28w 3d         75  %  LV:        6.1  mm  CM:        4.4  mm  Est. FW:    1165  gm      2 lb 9 oz     24  % ---------------------------------------------------------------------- OB History  Gravidity:    4         Term:   3 ---------------------------------------------------------------------- Gestational Age  LMP:           28w 3d        Date:  04/10/21                  EDD:   01/15/22  U/S Today:     28w 2d                                        EDD:   01/16/22  Best:          28w 3d     Det. By:  LMP  (04/10/21)          EDD:   01/15/22 ---------------------------------------------------------------------- Anatomy  Cranium:               Appears normal         LVOT:                   Appears normal  Cavum:                 Appears normal         Aortic Arch:            Appears normal  Ventricles:            Appears normal         Ductal Arch:            Appears normal  Choroid Plexus:        Appears normal         Diaphragm:              Appears normal  Cerebellum:            Appears normal         Stomach:                Appears normal, left  sided  Posterior Fossa:       Appears normal         Abdomen:                Appears normal  Nuchal Fold:           Not applicable (>32    Abdominal Wall:         Appears nml (cord                         wks GA)                                        insert, abd  wall)  Face:                  Orbits appear          Cord Vessels:           Appears normal (3                         normal                                         vessel cord)  Lips:                  Appears normal         Kidneys:                Appear normal  Palate:                Not well visualized    Bladder:                Appears normal  Thoracic:              Appears normal         Spine:                  Not well visualized  Heart:                 Appears normal         Upper Extremities:      Visualized                         (4CH, axis, and                         situs)  RVOT:                  Appears normal         Lower Extremities:      Visualized  Other:  Hands and feet not well visualized. Lenses visualized. 3VTV          visualized. ---------------------------------------------------------------------- Cervix Uterus Adnexa  Cervix  Not visualized (advanced GA >24wks)  Uterus  Single fibroid noted, see table below.  Right Ovary  Within normal limits.  Left Ovary  Not visualized.  Cul De Sac  No free fluid seen.  Adnexa  No abnormality visualized. ---------------------------------------------------------------------- Myomas  Site  L(cm)      W(cm)      D(cm)       Location  Fundus                   9.8        8.9        10.4 ----------------------------------------------------------------------  Blood Flow                  RI       PI       Comments ---------------------------------------------------------------------- Impression  Follow up growth due to elevated BMI  Normal interval growth with measurements consistent with  dates  Good fetal movement and amniotic fluid volume  Known large fibroid uterus of 9 x 9 x 10 cm. ---------------------------------------------------------------------- Recommendations  Consider repeat growth at 32 weeks. ----------------------------------------------------------------------              Sander Nephew, MD Electronically Signed Final  Report   10/26/2021 05:06 pm ----------------------------------------------------------------------    Medications:  Scheduled  docusate sodium  100 mg Oral Daily   indomethacin  25 mg Oral Q6H   oxytocin 40 units in LR 1000 mL  333 mL Intravenous Once   prenatal multivitamin  1 tablet Oral Q1200   I have reviewed the patient's current medications.  ASSESSMENT: [id number] 36w5dEstimated Date of Delivery: 01/14/22  Pre term labor Fundal fibroid 10 cm  PLAN: >S/P BMZ x 2 >S/P magnesium sulfate tocolysis/prophylaxis >Indocin x 48 hours for PTL >S/P NICU consult  Will assess tomorrow for possible discharge, including cervical exam  Janetta Vandoren H Dionte Blaustein 10/27/2021,11:18 AM

## 2021-10-28 ENCOUNTER — Other Ambulatory Visit (HOSPITAL_COMMUNITY): Payer: Self-pay

## 2021-10-28 LAB — CULTURE, BETA STREP (GROUP B ONLY)

## 2021-10-28 MED ORDER — NIFEDIPINE 10 MG PO CAPS
10.0000 mg | ORAL_CAPSULE | ORAL | 1 refills | Status: DC | PRN
  Filled 2021-10-28: qty 30, 5d supply, fill #0

## 2021-10-28 NOTE — Progress Notes (Signed)
   10/28/21 1238  Departure Condition  Departure Condition Good  Mobility at [facility]  Patient/Caregiver Teaching Teach Back Method Used;Discharge instructions reviewed;Prescriptions reviewed;Follow-up care reviewed;Medications discussed;Patient/caregiver verbalized understanding  Departure Mode With family  Was procedural sedation performed on this patient during this visit? No   Patient alert and oriented x4, ambulatory, VS and pain stable at discharge.

## 2021-10-28 NOTE — Discharge Summary (Signed)
Physician Discharge Summary  Patient ID: Mary Levine MRN: [id number] DOB/AGE: 03-23-1985 36 y.o.  Admit date: 10/25/2021 Discharge date: 10/28/2021  Admission Diagnoses: PTL at [redacted]w[redacted]d Discharge Diagnoses:  Principal Problem:   Preterm labor in third trimester   Discharged Condition: good  Hospital Course:  >Admit 10/25/21 with regular painful uterine contractions, cervix 4 cm/th >did not respond to fluid + procardia >received betamethasone x 2(8/8, 8/9) >received indocin beginning 8/8 for 72 hours >Magnesium x 24 hours(8/9 2107) >Pen G(prophylactic)-->GBS culture negative >sonogram was normal  >contractions stopped, watched OBSC for >24 hours >received no prn procardia while on OBSC  >At discharge cervix was 3/th/no presenting part palpable       Consults:  neonatology  Significant Diagnostic Studies: labs:  and sonogram  Treatments: as above  Discharge Exam: Blood pressure (!) 107/52, pulse 64, temperature 97.7 F (36.5 C), temperature source Oral, resp. rate 14, height '5\' 2"'$  (1.575 m), last menstrual period 11/14/2020, SpO2 98 %. General appearance: alert, cooperative, and no distress GI: soft, non-tender; bowel sounds normal; no masses,  no organomegaly Pelvic: cervix 3/thick/soft/no presenting part-->much improved  Disposition:  Home Follow up [facility] 1 week Procardia prn contraction     Signed: LFlorian Buff8/01/2022, 10:25 AM

## 2021-10-28 NOTE — Plan of Care (Signed)
  Problem: Education: Goal: Knowledge of General Education information will improve Description: Including pain rating scale, medication(s)/side effects and non-pharmacologic comfort measures Outcome: Adequate for Discharge   Problem: Health Behavior/Discharge Planning: Goal: Ability to manage health-related needs will improve Outcome: Adequate for Discharge   Problem: Clinical Measurements: Goal: Ability to maintain clinical measurements within normal limits will improve Outcome: Adequate for Discharge Goal: Will remain free from infection Outcome: Adequate for Discharge Goal: Diagnostic test results will improve Outcome: Adequate for Discharge Goal: Respiratory complications will improve Outcome: Adequate for Discharge Goal: Cardiovascular complication will be avoided Outcome: Adequate for Discharge   Problem: Activity: Goal: Risk for activity intolerance will decrease Outcome: Adequate for Discharge   Problem: Nutrition: Goal: Adequate nutrition will be maintained Outcome: Adequate for Discharge   Problem: Coping: Goal: Level of anxiety will decrease Outcome: Adequate for Discharge   Problem: Elimination: Goal: Will not experience complications related to bowel motility Outcome: Adequate for Discharge Goal: Will not experience complications related to urinary retention Outcome: Adequate for Discharge   Problem: Pain Managment: Goal: General experience of comfort will improve Outcome: Adequate for Discharge   Problem: Safety: Goal: Ability to remain free from injury will improve Outcome: Adequate for Discharge   Problem: Skin Integrity: Goal: Risk for impaired skin integrity will decrease Outcome: Adequate for Discharge   Problem: Education: Goal: Knowledge of disease or condition will improve Outcome: Adequate for Discharge Goal: Knowledge of the prescribed therapeutic regimen will improve Outcome: Adequate for Discharge Goal: Individualized Educational  Video(s) Outcome: Adequate for Discharge   Problem: Clinical Measurements: Goal: Complications related to the disease process, condition or treatment will be avoided or minimized Outcome: Adequate for Discharge   Problem: Education: Goal: Knowledge of Childbirth will improve Outcome: Adequate for Discharge Goal: Ability to make informed decisions regarding treatment and plan of care will improve Outcome: Adequate for Discharge Goal: Ability to state and carry out methods to decrease the pain will improve Outcome: Adequate for Discharge Goal: Individualized Educational Video(s) Outcome: Adequate for Discharge   Problem: Coping: Goal: Ability to verbalize concerns and feelings about labor and delivery will improve Outcome: Adequate for Discharge   Problem: Life Cycle: Goal: Ability to make normal progression through stages of labor will improve Outcome: Adequate for Discharge Goal: Ability to effectively push during vaginal delivery will improve Outcome: Adequate for Discharge   Problem: Role Relationship: Goal: Will demonstrate positive interactions with the child Outcome: Adequate for Discharge   Problem: Safety: Goal: Risk of complications during labor and delivery will decrease Outcome: Adequate for Discharge   Problem: Pain Management: Goal: Relief or control of pain from uterine contractions will improve Outcome: Adequate for Discharge

## 2021-11-05 ENCOUNTER — Inpatient Hospital Stay (HOSPITAL_COMMUNITY): Payer: Medicaid Other | Admitting: Anesthesiology

## 2021-11-05 ENCOUNTER — Other Ambulatory Visit: Payer: Self-pay

## 2021-11-05 ENCOUNTER — Inpatient Hospital Stay (HOSPITAL_COMMUNITY)
Admission: AD | Admit: 2021-11-05 | Discharge: 2021-11-08 | DRG: 805 | Disposition: A | Discharge status: Home / Self Care | Payer: Medicaid Other | Attending: Obstetrics and Gynecology | Admitting: Obstetrics and Gynecology

## 2021-11-05 ENCOUNTER — Encounter (HOSPITAL_COMMUNITY): Payer: Self-pay | Admitting: Family Medicine

## 2021-11-05 DIAGNOSIS — R6520 Severe sepsis without septic shock: Secondary | ICD-10-CM | POA: Diagnosis not present

## 2021-11-05 DIAGNOSIS — O9952 Diseases of the respiratory system complicating childbirth: Secondary | ICD-10-CM | POA: Diagnosis not present

## 2021-11-05 DIAGNOSIS — Z3A30 30 weeks gestation of pregnancy: Secondary | ICD-10-CM | POA: Diagnosis not present

## 2021-11-05 DIAGNOSIS — Z789 Other specified health status: Secondary | ICD-10-CM | POA: Diagnosis present

## 2021-11-05 DIAGNOSIS — O3410 Maternal care for benign tumor of corpus uteri, unspecified trimester: Secondary | ICD-10-CM | POA: Diagnosis present

## 2021-11-05 DIAGNOSIS — D259 Leiomyoma of uterus, unspecified: Secondary | ICD-10-CM | POA: Diagnosis present

## 2021-11-05 DIAGNOSIS — O26893 Other specified pregnancy related conditions, third trimester: Secondary | ICD-10-CM | POA: Diagnosis present

## 2021-11-05 DIAGNOSIS — O43193 Other malformation of placenta, third trimester: Secondary | ICD-10-CM | POA: Diagnosis present

## 2021-11-05 DIAGNOSIS — R6521 Severe sepsis with septic shock: Secondary | ICD-10-CM | POA: Diagnosis not present

## 2021-11-05 DIAGNOSIS — Z23 Encounter for immunization: Secondary | ICD-10-CM | POA: Diagnosis not present

## 2021-11-05 DIAGNOSIS — J9601 Acute respiratory failure with hypoxia: Secondary | ICD-10-CM | POA: Diagnosis not present

## 2021-11-05 DIAGNOSIS — E871 Hypo-osmolality and hyponatremia: Secondary | ICD-10-CM | POA: Diagnosis present

## 2021-11-05 DIAGNOSIS — O411230 Chorioamnionitis, third trimester, not applicable or unspecified: Secondary | ICD-10-CM | POA: Diagnosis present

## 2021-11-05 DIAGNOSIS — A419 Sepsis, unspecified organism: Secondary | ICD-10-CM | POA: Diagnosis present

## 2021-11-05 DIAGNOSIS — O99284 Endocrine, nutritional and metabolic diseases complicating childbirth: Secondary | ICD-10-CM | POA: Diagnosis present

## 2021-11-05 DIAGNOSIS — O3413 Maternal care for benign tumor of corpus uteri, third trimester: Secondary | ICD-10-CM | POA: Diagnosis present

## 2021-11-05 DIAGNOSIS — R579 Shock, unspecified: Secondary | ICD-10-CM | POA: Diagnosis not present

## 2021-11-05 DIAGNOSIS — O99214 Obesity complicating childbirth: Secondary | ICD-10-CM | POA: Diagnosis present

## 2021-11-05 HISTORY — DX: Other specified health status: Z78.9

## 2021-11-05 LAB — CBC
HCT: 36.3 % (ref 36.0–46.0)
Hemoglobin: 11.9 g/dL — ABNORMAL LOW (ref 12.0–15.0)
MCH: 29.8 pg (ref 26.0–34.0)
MCHC: 32.8 g/dL (ref 30.0–36.0)
MCV: 91 fL (ref 80.0–100.0)
Platelets: 331 10*3/uL (ref 150–400)
RBC: 3.99 MIL/uL (ref 3.87–5.11)
RDW: 12.4 % (ref 11.5–15.5)
WBC: 12.8 10*3/uL — ABNORMAL HIGH (ref 4.0–10.5)
nRBC: 0 % (ref 0.0–0.2)

## 2021-11-05 LAB — TYPE AND SCREEN
ABO/RH(D): O POS
Antibody Screen: NEGATIVE

## 2021-11-05 MED ORDER — LACTATED RINGERS IV SOLN
500.0000 mL | INTRAVENOUS | Status: DC | PRN
  Administered 2021-11-06: 1000 mL via INTRAVENOUS
  Administered 2021-11-06: 500 mL via INTRAVENOUS

## 2021-11-05 MED ORDER — OXYCODONE-ACETAMINOPHEN 5-325 MG PO TABS: 2.0000 | ORAL_TABLET | ORAL | Status: DC | PRN

## 2021-11-05 MED ORDER — LACTATED RINGERS IV SOLN: 500.0000 mL | Freq: Once | INTRAVENOUS | Status: AC

## 2021-11-05 MED ORDER — LACTATED RINGERS IV BOLUS
1000.0000 mL | Freq: Once | INTRAVENOUS | Status: AC
Start: 2021-11-05 — End: 2021-11-05
  Administered 2021-11-05: 1000 mL via INTRAVENOUS

## 2021-11-05 MED ORDER — ONDANSETRON HCL 4 MG/2ML IJ SOLN: 4.0000 mg | Freq: Four times a day (QID) | INTRAMUSCULAR | Status: DC | PRN

## 2021-11-05 MED ORDER — SODIUM CHLORIDE 0.9 % IV SOLN
5.0000 10*6.[IU] | Freq: Once | INTRAVENOUS | Status: AC
  Administered 2021-11-05: 5 10*6.[IU] via INTRAVENOUS
  Filled 2021-11-05: qty 5

## 2021-11-05 MED ORDER — NIFEDIPINE 10 MG PO CAPS
10.0000 mg | ORAL_CAPSULE | ORAL | Status: DC | PRN
  Administered 2021-11-05 (×2): 10 mg via ORAL
  Filled 2021-11-05 (×3): qty 1

## 2021-11-05 MED ORDER — FENTANYL CITRATE (PF) 100 MCG/2ML IJ SOLN: 100.0000 ug | Freq: Once | INTRAMUSCULAR | Status: DC | PRN

## 2021-11-05 MED ORDER — FENTANYL CITRATE (PF) 100 MCG/2ML IJ SOLN
100.0000 ug | Freq: Once | INTRAMUSCULAR | Status: AC
  Administered 2021-11-05: 100 ug via INTRAVENOUS
  Filled 2021-11-05: qty 2

## 2021-11-05 MED ORDER — MAGNESIUM SULFATE 40 GM/1000ML IV SOLN: 2.0000 g/h | INTRAVENOUS | Status: AC

## 2021-11-05 MED ORDER — TRANEXAMIC ACID-NACL 1000-0.7 MG/100ML-% IV SOLN: 1000.0000 mg | Freq: Once | INTRAVENOUS | Status: DC

## 2021-11-05 MED ORDER — MAGNESIUM SULFATE BOLUS VIA INFUSION
6.0000 g | Freq: Once | INTRAVENOUS | Status: AC
  Administered 2021-11-05: 6 g via INTRAVENOUS
  Filled 2021-11-05: qty 1000

## 2021-11-05 MED ORDER — SOD CITRATE-CITRIC ACID 500-334 MG/5ML PO SOLN: 30.0000 mL | ORAL | Status: DC | PRN

## 2021-11-05 MED ORDER — EPHEDRINE 5 MG/ML INJ
10.0000 mg | INTRAVENOUS | Status: AC | PRN
  Administered 2021-11-05 (×2): 10 mg via INTRAVENOUS

## 2021-11-05 MED ORDER — MAGNESIUM SULFATE 40 GM/1000ML IV SOLN
2.0000 g/h | INTRAVENOUS | Status: DC
  Filled 2021-11-05: qty 1000

## 2021-11-05 MED ORDER — PHENYLEPHRINE 80 MCG/ML (10ML) SYRINGE FOR IV PUSH (FOR BLOOD PRESSURE SUPPORT)
80.0000 ug | PREFILLED_SYRINGE | INTRAVENOUS | Status: DC | PRN
  Administered 2021-11-05: 240 ug via INTRAVENOUS

## 2021-11-05 MED ORDER — LIDOCAINE HCL (PF) 1 % IJ SOLN: 30.0000 mL | INTRAMUSCULAR | Status: DC | PRN

## 2021-11-05 MED ORDER — TRANEXAMIC ACID-NACL 1000-0.7 MG/100ML-% IV SOLN
INTRAVENOUS | Status: AC
  Filled 2021-11-05: qty 100

## 2021-11-05 MED ORDER — PENICILLIN G POT IN DEXTROSE 60000 UNIT/ML IV SOLN
3.0000 10*6.[IU] | INTRAVENOUS | Status: DC
  Administered 2021-11-05 (×2): 3 10*6.[IU] via INTRAVENOUS
  Filled 2021-11-05 (×2): qty 50

## 2021-11-05 MED ORDER — OXYCODONE-ACETAMINOPHEN 5-325 MG PO TABS: 1.0000 | ORAL_TABLET | ORAL | Status: DC | PRN

## 2021-11-05 MED ORDER — LIDOCAINE-EPINEPHRINE (PF) 2 %-1:200000 IJ SOLN
INTRAMUSCULAR | Status: DC | PRN
  Administered 2021-11-05: 5 mL via EPIDURAL

## 2021-11-05 MED ORDER — FENTANYL-BUPIVACAINE-NACL 0.5-0.125-0.9 MG/250ML-% EP SOLN
12.0000 mL/h | EPIDURAL | Status: DC | PRN
  Administered 2021-11-05: 12 mL/h via EPIDURAL
  Filled 2021-11-05: qty 250

## 2021-11-05 MED ORDER — EPHEDRINE 5 MG/ML INJ
10.0000 mg | INTRAVENOUS | Status: DC | PRN
  Filled 2021-11-05: qty 5

## 2021-11-05 MED ORDER — OXYTOCIN BOLUS FROM INFUSION: 333.0000 mL | Freq: Once | INTRAVENOUS | Status: DC

## 2021-11-05 MED ORDER — LACTATED RINGERS IV SOLN: INTRAVENOUS | Status: DC

## 2021-11-05 MED ORDER — ACETAMINOPHEN 325 MG PO TABS: 650.0000 mg | ORAL_TABLET | ORAL | Status: DC | PRN

## 2021-11-05 MED ORDER — PHENYLEPHRINE 80 MCG/ML (10ML) SYRINGE FOR IV PUSH (FOR BLOOD PRESSURE SUPPORT)
80.0000 ug | PREFILLED_SYRINGE | INTRAVENOUS | Status: DC | PRN
  Filled 2021-11-05: qty 10

## 2021-11-05 MED ORDER — DIPHENHYDRAMINE HCL 50 MG/ML IJ SOLN: 12.5000 mg | INTRAMUSCULAR | Status: DC | PRN

## 2021-11-05 MED ORDER — OXYTOCIN-SODIUM CHLORIDE 30-0.9 UT/500ML-% IV SOLN
2.5000 [IU]/h | INTRAVENOUS | Status: DC
  Filled 2021-11-05: qty 500

## 2021-11-05 MED ORDER — FLEET ENEMA 7-19 GM/118ML RE ENEM: 1.0000 | ENEMA | RECTAL | Status: DC | PRN

## 2021-11-05 NOTE — MAU Note (Signed)
.  Mary Levine is a 36 y.o. at 80w0dhere in MAU reporting: she started this morning . Ctx 2-5 mn apart.has not felt baby move in 2 hours . Reports leaking some clear fluid with some blood in it.  Pt brought straight back to room . E. Lawrence,NP checked cervix and 4.5cm. pt has been hospilized 2 weeks ago and was dilated 4cm. Onset of complaint: this morning Pain score: 8 There were no vitals filed for this visit.   FHT:155 Lab orders placed from triage:

## 2021-11-05 NOTE — Progress Notes (Signed)
Labor Progress Note Mary Levine is a 36 y.o. [phone] at 38w0dpresented for preterm contractions and admitted in preterm labor S:  doing ok. Comfortable with epidural. Nausea has improved  O:  BP (!) 98/55   Pulse (!) 103   Temp 99.2 F (37.3 C) (Oral)   Resp 20   LMP 11/14/2020   SpO2 97%  EFM: 155 - 160 bpm/moderate /+accels  CVE: Dilation: 6 Effacement (%): 100 Cervical Position: Middle Station: 0, -1 Presentation: Vertex Exam by:: Nudule, MD   A&P: 36y.o. [id number] admission due to preterm labor. S/p BMX in the last 2 weeks, currently on Mg sulf #Labor: no cervical change since admission.  - continue to monitor; hold off AROM to buy time given preterm and watch for progression of cervical dilation #Pain:  has epidural in place #FWB: Category 1 (given 30 weeks) #GBS  pending - on PCN  Jordanna Hendrie J Sherrilyn RistMD 6:52 PM

## 2021-11-05 NOTE — Anesthesia Procedure Notes (Addendum)
Epidural Patient location during procedure: OB Start time: 11/05/2021 5:05 PM End time: 11/05/2021 5:15 PM  Staffing Anesthesiologist: Freddrick March, MD Performed: anesthesiologist   Preanesthetic Checklist Completed: patient identified, IV checked, risks and benefits discussed, monitors and equipment checked, pre-op evaluation and timeout performed  Epidural Patient position: sitting Prep: DuraPrep and site prepped and draped Patient monitoring: continuous pulse ox, blood pressure, heart rate and cardiac monitor Approach: midline Location: L3-L4 Injection technique: LOR air  Needle:  Needle type: Tuohy  Needle gauge: 17 G Needle length: 9 cm Needle insertion depth: 7 cm Catheter type: closed end flexible Catheter size: 19 Gauge Catheter at skin depth: 13 cm Test dose: negative  Assessment Sensory level: T8 Events: blood not aspirated, injection not painful, no injection resistance, no paresthesia and negative IV test  Additional Notes Patient identified. Risks/Benefits/Options discussed with patient including but not limited to bleeding, infection, nerve damage, paralysis, failed block, incomplete pain control, headache, blood pressure changes, nausea, vomiting, reactions to medication both or allergic, itching and postpartum back pain. Confirmed with bedside nurse the patient's most recent platelet count. Confirmed with patient that they are not currently taking any anticoagulation, have any bleeding history or any family history of bleeding disorders. Patient expressed understanding and wished to proceed. All questions were answered. Sterile technique was used throughout the entire procedure. Please see nursing notes for vital signs. Test dose was given through epidural catheter and negative prior to continuing to dose epidural or start infusion. Warning signs of high block given to the patient including shortness of breath, tingling/numbness in hands, complete motor block,  or any concerning symptoms with instructions to call for help. Patient was given instructions on fall risk and not to get out of bed. All questions and concerns addressed with instructions to call with any issues or inadequate analgesia.  Reason for block:procedure for pain

## 2021-11-05 NOTE — Anesthesia Preprocedure Evaluation (Signed)
Anesthesia Evaluation  Patient identified by MRN, date of birth, ID band Patient awake    Reviewed: Allergy & Precautions, NPO status , Patient's Chart, lab work & pertinent test results  Airway Mallampati: III  TM Distance: >3 FB Neck ROM: Full    Dental no notable dental hx.    Pulmonary neg pulmonary ROS,    Pulmonary exam normal breath sounds clear to auscultation       Cardiovascular negative cardio ROS Normal cardiovascular exam Rhythm:Regular Rate:Normal     Neuro/Psych negative neurological ROS  negative psych ROS   GI/Hepatic negative GI ROS, Neg liver ROS,   Endo/Other  Morbid obesity  Renal/GU negative Renal ROS  negative genitourinary   Musculoskeletal negative musculoskeletal ROS (+)   Abdominal   Peds  Hematology negative hematology ROS (+)   Anesthesia Other Findings Preterm labor  Reproductive/Obstetrics (+) Pregnancy                             Anesthesia Physical Anesthesia Plan  ASA: 3  Anesthesia Plan: Epidural   Post-op Pain Management:    Induction:   PONV Risk Score and Plan: Treatment may vary due to age or medical condition  Airway Management Planned: Natural Airway  Additional Equipment:   Intra-op Plan:   Post-operative Plan:   Informed Consent: I have reviewed the patients History and Physical, chart, labs and discussed the procedure including the risks, benefits and alternatives for the proposed anesthesia with the patient or authorized representative who has indicated his/her understanding and acceptance.       Plan Discussed with: Anesthesiologist  Anesthesia Plan Comments: (Patient identified. Risks, benefits, options discussed with patient including but not limited to bleeding, infection, nerve damage, paralysis, failed block, incomplete pain control, headache, blood pressure changes, nausea, vomiting, reactions to medication, itching,  and post partum back pain. Confirmed with bedside nurse the patient's most recent platelet count. Confirmed with the patient that they are not taking any anticoagulation, have any bleeding history or any family history of bleeding disorders. Patient expressed understanding and wishes to proceed. All questions were answered. )        Anesthesia Quick Evaluation

## 2021-11-05 NOTE — MAU Provider Note (Signed)
History     [id number]  Arrival date and time: 11/05/21 1212    Chief Complaint  Patient presents with   Contractions     HPI Mary Levine is a 36 y.o. at 51w0dwho presents for contractions. Reports painful contractions every 2 minutes since this morning. Increase in pelvic pressure. Also noticed increase in blood tinged discharge. Denies LOF. Positive fetal movement.    OB History     Gravida  4   Para  3   Term  3   Preterm      AB      Living  3      SAB      IAB      Ectopic      Multiple      Live Births  3           Past Medical History:  Diagnosis Date   Medical history non-contributory     Past Surgical History:  Procedure Laterality Date   HERNIA REPAIR      No family history on file.  Allergies  Allergen Reactions   Shrimp (Diagnostic) Anaphylaxis    No current facility-administered medications on file prior to encounter.   Current Outpatient Medications on File Prior to Encounter  Medication Sig Dispense Refill   aspirin EC 81 MG tablet Take 81 mg by mouth daily. Swallow whole.     NIFEdipine (PROCARDIA) 10 MG capsule Take 1 capsule (10 mg total) by mouth every 4 (four) hours as needed (contractions). 30 capsule 1   Prenatal Vit-Fe Fumarate-FA (PRENATAL MULTIVITAMIN) TABS tablet Take 1 tablet by mouth daily at 12 noon.       ROS Pertinent positives and negative per HPI, all others reviewed and negative  Physical Exam   BP 109/65   Pulse (!) 108   Temp 98.8 F (37.1 C) (Oral)   Resp 18   LMP 11/14/2020   Patient Vitals for the past 24 hrs:  BP Temp Temp src Pulse Resp  11/05/21 1308 109/65 -- -- -- --  11/05/21 1228 134/72 98.8 F (37.1 C) Oral (!) 108 18    Physical Exam Vitals and nursing note reviewed. Exam conducted with a chaperone present.  Constitutional:      Comments: Breathing through contractions & visibly uncomfortable  Pulmonary:     Effort: Pulmonary effort is normal. No respiratory  distress.  Abdominal:     Palpations: Abdomen is soft.     Tenderness: There is no abdominal tenderness.  Skin:    General: Skin is warm and dry.  Psychiatric:        Mood and Affect: Mood normal.        Behavior: Behavior normal.      Cervical Exam Dilation: 5.5 Effacement (%): 80 Cervical Position: Middle Station: -3 Presentation: Vertex Exam by:: EJorje GuildNP   FHT Baseline 150, moderate variability, 10x10 accels, no decels Toco: Q2-5 minutes Cat: 1  Labs No results found for this or any previous visit (from the past 24 hour(s)).  Imaging No results found.  MAU Course  Procedures Lab Orders  No laboratory test(s) ordered today   Meds ordered this encounter  Medications   lactated ringers bolus 1,000 mL   fentaNYL (SUBLIMAZE) injection 100 mcg   NIFEdipine (PROCARDIA) capsule 10 mg   Imaging Orders  No imaging studies ordered today    MDM Pt admitted 10 days ago for preterm labor. Was 3 cm dilated at time of discharge. Was  given BMZ & mag for CP prophylaxis.  On arrival cervical exam was 4.5/50%. Was treated with IV fluids, fentanyl & procardia. Patient reports improvement in symptoms but made cervical change to 5.5/80% Assessment and Plan   1. Preterm labor in third trimester without delivery    -Admit to birthing suites per Dr. Kennon Rounds -NICU aware  Jorje Guild, NP 11/05/21 2:32 PM

## 2021-11-05 NOTE — Progress Notes (Signed)
Called in by RN, patient had just received epidural and had a drop in her BP as low as 60s/30s. Baby noted to have tachycardia to the low 180s. No decels.  2 doses of ephedrine given, with minimal response.  Anesthesia was called, recommended 240cc of phenylephrine, which was given by RN.  BP improved to the high 80s/50s, patient's baseline 90-110/50s - 60s.  Fetal tachycardia improving.  She is currently on Mg sulf infusion for tocolysis and neuroprotection in the context of preterm labor.  Cervix: 6cm / 90% / station -2, bulging bag noted.  Plan: continue to monitor BP and fetal status closely.  Liliane Channel MD MPH OB Fellow, Faculty Practice

## 2021-11-05 NOTE — H&P (Addendum)
OB History and Physical  Atziry M Evetta Renner is an 36 y.o. [phone] 89w0dfemale.   Chief Complaint: contractions HPI: Hospitalized earlier this month with PTL and noted to be 4 cm. Contractions stopped then with Magnesium and Indocin. She was stable at home for 10 days or so and this am awoke with significant increase in contractions and pressure. She reports blood tinged DC as well.   PNC: [facility]  OB History     Gravida  4   Para  3   Term  3   Preterm      AB      Living  3      SAB      IAB      Ectopic      Multiple      Live Births  3           Past Medical History:  Diagnosis Date   Medical history non-contributory     Past Surgical History:  Procedure Laterality Date   HERNIA REPAIR      History reviewed. No pertinent family history. Social History:  reports that she has never smoked. She has never used smokeless tobacco. She reports that she does not drink alcohol and does not use drugs.    Allergies  Allergen Reactions   Shrimp (Diagnostic) Anaphylaxis    Medications Prior to Admission  Medication Sig Dispense Refill   aspirin EC 81 MG tablet Take 81 mg by mouth daily. Swallow whole.     NIFEdipine (PROCARDIA) 10 MG capsule Take 1 capsule (10 mg total) by mouth every 4 (four) hours as needed (contractions). 30 capsule 1   Prenatal Vit-Fe Fumarate-FA (PRENATAL MULTIVITAMIN) TABS tablet Take 1 tablet by mouth daily at 12 noon.       Pertinent items are noted in HPI.  Objective: Blood pressure (!) 98/55, pulse (!) 103, temperature 99.2 F (37.3 C), temperature source Oral, resp. rate 20, last menstrual period 11/14/2020, SpO2 97 %. General appearance: alert, cooperative, appears stated age, and painfully contracting Head: Normocephalic, without obvious abnormality, atraumatic Neck: supple, symmetrical, trachea midline Lungs:  Normal effort Heart: regular rate and rhythm Abdomen:  gravid,  Non-tender Extremities: extremities normal, atraumatic, no cyanosis or edema Skin: Skin color, texture, turgor normal. No rashes or lesions Neurologic: Grossly normal   Labs: Results for orders placed or performed during the hospital encounter of 11/05/21 (from the past 24 hour(s))  CBC     Status: Abnormal   Collection Time: 11/05/21  2:53 PM  Result Value Ref Range   WBC 12.8 (H) 4.0 - 10.5 K/uL   RBC 3.99 3.87 - 5.11 MIL/uL   Hemoglobin 11.9 (L) 12.0 - 15.0 g/dL   HCT 36.3 36.0 - 46.0 %   MCV 91.0 80.0 - 100.0 fL   MCH 29.8 26.0 - 34.0 pg   MCHC 32.8 30.0 - 36.0 g/dL   RDW 12.4 11.5 - 15.5 %   Platelets 331 150 - 400 K/uL   nRBC 0.0 0.0 - 0.2 %  Type and screen [facility]    Status: None   Collection Time: 11/05/21  2:53 PM  Result Value Ref Range   ABO/RH(D) O POS    Antibody Screen NEG    Sample Expiration      11/08/2021,2359 Performed at [facility] [address] [place] [address]            ABO, Rh: --/--/O POS (08/19 1453)  Antibody: NEG (08/19 1453)  Rubella: Immune (06/08 0000)  RPR: NON REACTIVE (08/08 1713)  HBsAg: Negative (06/08 0000)  HIV: Non-reactive (06/08 0000)  GBS:      Radiology studies: [place] MFM OB COMP + 14 WK  Result Date: 10/26/2021 ----------------------------------------------------------------------  OBSTETRICS REPORT                       (Signed Final 10/26/2021 05:06 pm) ---------------------------------------------------------------------- Patient Info  ID #:       [id number]                          D.O.B.:  03/23/1985 (36 yrs)  Name:       SHAMANDA LEN TORREZ             Visit Date: 10/26/2021 08:07 am ---------------------------------------------------------------------- Performed By  Attending:        Sander Nephew      Referred By:      [facility]                    MD                                       Suites  Performed By:     Nevin Bloodgood          [facility]                     [place] ---------------------------------------------------------------------- Orders  #  Description                           Code        Ordered By  1  [place] MFM OB COMP + 14 WK                02725.36    PAULA DUNCAN ----------------------------------------------------------------------  #  Order #                     Accession #                Episode #  1  [id number]                   [id number]                 [id number] ---------------------------------------------------------------------- Indications  Preterm labor without delivery, unspecified    O60.00  trimester  Encounter for antenatal screening for          Z36.3  malformations  Advanced maternal age multigravida 48+,        O86.523  third trimester  Uterine fibroids affecting pregnancy in third  O34.13, D25.9  trimester, antepartum  [redacted] weeks gestation of pregnancy                Z3A.28 ---------------------------------------------------------------------- Fetal Evaluation  Num Of Fetuses:         1  Fetal Heart Rate(bpm):  132  Cardiac Activity:       Observed  Presentation:  Cephalic  Placenta:               Ant w/ post fund Succenturiate lobe  P. Cord Insertion:      Not well visualized  Amniotic Fluid  AFI FV:      Within normal limits  AFI Sum(cm)     %Tile       Largest Pocket(cm)  12.21           30          4.71  RUQ(cm)       RLQ(cm)       LUQ(cm)        LLQ(cm)  3.06          4.71          2.15           2.29 ---------------------------------------------------------------------- Biometry  BPD:     70.89  mm     G. Age:  28w 3d         39  %    CI:        73.94   %    70 - 86                                                          FL/HC:      20.7   %    18.8 - 20.6  HC:    261.83   mm     G. Age:  28w 3d         20  %    HC/AC:      1.13        1.05 - 1.21  AC:    231.17   mm     G. Age:  27w 3d         17  %    FL/BPD:     76.4   %    71 - 87  FL:      54.15  mm     G. Age:  28w 5d          41  %    FL/AC:      23.4   %    20 - 24  HUM:      49.6  mm     G. Age:  29w 1d         58  %  CER:      33.5  mm     G. Age:  28w 3d         66  %  LV:        6.1  mm  CM:        4.4  mm  Est. FW:    1165  gm      2 lb 9 oz     24  % ---------------------------------------------------------------------- OB History  Gravidity:    4         Term:   3 ---------------------------------------------------------------------- Gestational Age  LMP:           28w 3d        Date:  04/10/21                  EDD:   01/15/22  U/S Today:  28w 2d                                        EDD:   01/16/22  Best:          Timothy Lasso 3d     Det. By:  LMP  (04/10/21)          EDD:   01/15/22 ---------------------------------------------------------------------- Anatomy  Cranium:               Appears normal         LVOT:                   Appears normal  Cavum:                 Appears normal         Aortic Arch:            Appears normal  Ventricles:            Appears normal         Ductal Arch:            Appears normal  Choroid Plexus:        Appears normal         Diaphragm:              Appears normal  Cerebellum:            Appears normal         Stomach:                Appears normal, left                                                                        sided  Posterior Fossa:       Appears normal         Abdomen:                Appears normal  Nuchal Fold:           Not applicable (>81    Abdominal Wall:         Appears nml (cord                         wks GA)                                        insert, abd wall)  Face:                  Orbits appear          Cord Vessels:           Appears normal (3                         normal  vessel cord)  Lips:                  Appears normal         Kidneys:                Appear normal  Palate:                Not well visualized    Bladder:                Appears normal  Thoracic:              Appears normal         Spine:                   Not well visualized  Heart:                 Appears normal         Upper Extremities:      Visualized                         (4CH, axis, and                         situs)  RVOT:                  Appears normal         Lower Extremities:      Visualized  Other:  Hands and feet not well visualized. Lenses visualized. 3VTV          visualized. ---------------------------------------------------------------------- Cervix Uterus Adnexa  Cervix  Not visualized (advanced GA >24wks)  Uterus  Single fibroid noted, see table below.  Right Ovary  Within normal limits.  Left Ovary  Not visualized.  Cul De Sac  No free fluid seen.  Adnexa  No abnormality visualized. ---------------------------------------------------------------------- Myomas  Site                     L(cm)      W(cm)      D(cm)       Location  Fundus                   9.8        8.9        10.4 ----------------------------------------------------------------------  Blood Flow                  RI       PI       Comments ---------------------------------------------------------------------- Impression  Follow up growth due to elevated BMI  Normal interval growth with measurements consistent with  dates  Good fetal movement and amniotic fluid volume  Known large fibroid uterus of 9 x 9 x 10 cm. ---------------------------------------------------------------------- Recommendations  Consider repeat growth at 32 weeks. ----------------------------------------------------------------------              Sander Nephew, MD Electronically Signed Final Report   10/26/2021 05:06 pm ----------------------------------------------------------------------   Prenatal Transfer Tool  Maternal Diabetes: No Genetic Screening: Normal Maternal Ultrasounds/Referrals: Normal Fetal Ultrasounds or other Referrals:  None Maternal Substance Abuse:  No Significant Maternal Medications:  Meds include: Other: ASA Significant Maternal Lab Results: Group B Strep  negative  Assessment/Plan Principal Problem:   Preterm labor in third trimester Active Problems:   Preterm labor AMA  #Labor: Begin Magnesium to stop labor; patient had steroids in the  last 2 weeks. #Pain: IV pain meds or epidural if advances #FWB:  reassuring #ID:    GBS negative 10 days ago, but premature so begin Abx #MOF:  breast #MOC: copper IUD postplacenta #Circ:  n/a   Beacher Every J Senta Kantor 11/05/2021, 6:49 PM

## 2021-11-06 ENCOUNTER — Inpatient Hospital Stay (HOSPITAL_COMMUNITY): Payer: Medicaid Other

## 2021-11-06 ENCOUNTER — Encounter (HOSPITAL_COMMUNITY): Payer: Self-pay | Admitting: Family Medicine

## 2021-11-06 DIAGNOSIS — D259 Leiomyoma of uterus, unspecified: Secondary | ICD-10-CM | POA: Diagnosis present

## 2021-11-06 DIAGNOSIS — Z789 Other specified health status: Secondary | ICD-10-CM | POA: Diagnosis present

## 2021-11-06 DIAGNOSIS — Z3A30 30 weeks gestation of pregnancy: Secondary | ICD-10-CM

## 2021-11-06 DIAGNOSIS — O42013 Preterm premature rupture of membranes, onset of labor within 24 hours of rupture, third trimester: Secondary | ICD-10-CM

## 2021-11-06 DIAGNOSIS — R579 Shock, unspecified: Secondary | ICD-10-CM | POA: Diagnosis not present

## 2021-11-06 DIAGNOSIS — O09523 Supervision of elderly multigravida, third trimester: Secondary | ICD-10-CM

## 2021-11-06 DIAGNOSIS — O43193 Other malformation of placenta, third trimester: Secondary | ICD-10-CM

## 2021-11-06 DIAGNOSIS — A419 Sepsis, unspecified organism: Secondary | ICD-10-CM | POA: Diagnosis not present

## 2021-11-06 DIAGNOSIS — M7989 Other specified soft tissue disorders: Secondary | ICD-10-CM

## 2021-11-06 DIAGNOSIS — R6520 Severe sepsis without septic shock: Secondary | ICD-10-CM | POA: Diagnosis not present

## 2021-11-06 DIAGNOSIS — R578 Other shock: Secondary | ICD-10-CM

## 2021-11-06 DIAGNOSIS — O3410 Maternal care for benign tumor of corpus uteri, unspecified trimester: Secondary | ICD-10-CM | POA: Diagnosis present

## 2021-11-06 LAB — GLUCOSE, CAPILLARY
Glucose-Capillary: 97 mg/dL (ref 70–99)
Glucose-Capillary: 98 mg/dL (ref 70–99)
Glucose-Capillary: 74 mg/dL (ref 70–99)
Glucose-Capillary: 92 mg/dL (ref 70–99)

## 2021-11-06 LAB — COMPREHENSIVE METABOLIC PANEL
ALT: 17 U/L (ref 0–44)
ALT: 16 U/L (ref 0–44)
AST: 29 U/L (ref 15–41)
AST: 32 U/L (ref 15–41)
Albumin: 2.4 g/dL — ABNORMAL LOW (ref 3.5–5.0)
Albumin: 1.9 g/dL — ABNORMAL LOW (ref 3.5–5.0)
Alkaline Phosphatase: 139 U/L — ABNORMAL HIGH (ref 38–126)
Alkaline Phosphatase: 103 U/L (ref 38–126)
Anion gap: 12 (ref 5–15)
Anion gap: 9 (ref 5–15)
BUN: <5 mg/dL — ABNORMAL LOW (ref 6–20)
BUN: <5 mg/dL — ABNORMAL LOW (ref 6–20)
CO2: 21 mmol/L — ABNORMAL LOW (ref 22–32)
CO2: 19 mmol/L — ABNORMAL LOW (ref 22–32)
Calcium: 7.5 mg/dL — ABNORMAL LOW (ref 8.9–10.3)
Calcium: 7.8 mg/dL — ABNORMAL LOW (ref 8.9–10.3)
Chloride: 98 mmol/L (ref 98–111)
Chloride: 108 mmol/L (ref 98–111)
Creatinine, Ser: 0.83 mg/dL (ref 0.44–1.00)
Creatinine, Ser: 0.74 mg/dL (ref 0.44–1.00)
GFR, Estimated: >60 mL/min (ref >60)
GFR, Estimated: >60 mL/min (ref >60)
Glucose, Bld: 113 mg/dL — ABNORMAL HIGH (ref 70–99)
Glucose, Bld: 146 mg/dL — ABNORMAL HIGH (ref 70–99)
Potassium: 4.7 mmol/L (ref 3.5–5.1)
Potassium: 3.5 mmol/L (ref 3.5–5.1)
Sodium: 131 mmol/L — ABNORMAL LOW (ref 135–145)
Sodium: 136 mmol/L (ref 135–145)
Total Bilirubin: 1.2 mg/dL (ref 0.3–1.2)
Total Bilirubin: 0.7 mg/dL (ref 0.3–1.2)
Total Protein: 6.2 g/dL — ABNORMAL LOW (ref 6.5–8.1)
Total Protein: 5 g/dL — ABNORMAL LOW (ref 6.5–8.1)

## 2021-11-06 LAB — CBC
HCT: 35.9 % — ABNORMAL LOW (ref 36.0–46.0)
HCT: 31 % — ABNORMAL LOW (ref 36.0–46.0)
Hemoglobin: 12 g/dL (ref 12.0–15.0)
Hemoglobin: 10.5 g/dL — ABNORMAL LOW (ref 12.0–15.0)
MCH: 29.9 pg (ref 26.0–34.0)
MCH: 30.3 pg (ref 26.0–34.0)
MCHC: 33.4 g/dL (ref 30.0–36.0)
MCHC: 33.9 g/dL (ref 30.0–36.0)
MCV: 89.5 fL (ref 80.0–100.0)
MCV: 89.3 fL (ref 80.0–100.0)
Platelets: 303 10*3/uL (ref 150–400)
Platelets: 279 10*3/uL (ref 150–400)
RBC: 4.01 MIL/uL (ref 3.87–5.11)
RBC: 3.47 MIL/uL — ABNORMAL LOW (ref 3.87–5.11)
RDW: 12.2 % (ref 11.5–15.5)
RDW: 12.4 % (ref 11.5–15.5)
WBC: 7.7 10*3/uL (ref 4.0–10.5)
WBC: 15.4 10*3/uL — ABNORMAL HIGH (ref 4.0–10.5)
nRBC: 0 % (ref 0.0–0.2)
nRBC: 0 % (ref 0.0–0.2)

## 2021-11-06 LAB — TROPONIN I (HIGH SENSITIVITY)
Troponin I (High Sensitivity): 10 ng/L (ref <18)
Troponin I (High Sensitivity): 14 ng/L (ref <18)

## 2021-11-06 LAB — ECHOCARDIOGRAM COMPLETE
AR max vel: 2.05 cm2
AV Area VTI: 1.91 cm2
AV Area mean vel: 1.97 cm2
AV Mean grad: 5 mmHg
AV Peak grad: 8.8 mmHg
Ao pk vel: 1.48 m/s
Area-P 1/2: 2.78 cm2
S' Lateral: 3.1 cm

## 2021-11-06 LAB — POCT I-STAT 7, (LYTES, BLD GAS, ICA,H+H)
Acid-base deficit: 3 mmol/L — ABNORMAL HIGH (ref 0.0–2.0)
Bicarbonate: 21.1 mmol/L (ref 20.0–28.0)
Calcium, Ion: 1.09 mmol/L — ABNORMAL LOW (ref 1.15–1.40)
HCT: 31 % — ABNORMAL LOW (ref 36.0–46.0)
Hemoglobin: 10.5 g/dL — ABNORMAL LOW (ref 12.0–15.0)
O2 Saturation: 96 %
Patient temperature: 100.1
Potassium: 3.4 mmol/L — ABNORMAL LOW (ref 3.5–5.1)
Sodium: 134 mmol/L — ABNORMAL LOW (ref 135–145)
TCO2: 22 mmol/L (ref 22–32)
pCO2 arterial: 32.8 mmHg (ref 32–48)
pH, Arterial: 7.419 (ref 7.35–7.45)
pO2, Arterial: 82 mmHg — ABNORMAL LOW (ref 83–108)

## 2021-11-06 LAB — MAGNESIUM: Magnesium: 2.9 mg/dL — ABNORMAL HIGH (ref 1.7–2.4)

## 2021-11-06 LAB — FIBRINOGEN: Fibrinogen: >800 mg/dL — ABNORMAL HIGH (ref 210–475)

## 2021-11-06 LAB — APTT: aPTT: 30 seconds (ref 24–36)

## 2021-11-06 LAB — MRSA NEXT GEN BY PCR, NASAL: MRSA by PCR Next Gen: NOT DETECTED

## 2021-11-06 LAB — PROCALCITONIN: Procalcitonin: 1.64 ng/mL

## 2021-11-06 LAB — RPR: RPR Ser Ql: NONREACTIVE

## 2021-11-06 LAB — LACTIC ACID, PLASMA
Lactic Acid, Venous: 5.1 mmol/L (ref 0.5–1.9)
Lactic Acid, Venous: 1.1 mmol/L (ref 0.5–1.9)

## 2021-11-06 LAB — PROTIME-INR
INR: 1 (ref 0.8–1.2)
Prothrombin Time: 13.4 seconds (ref 11.4–15.2)

## 2021-11-06 LAB — BRAIN NATRIURETIC PEPTIDE: B Natriuretic Peptide: 29.2 pg/mL (ref 0.0–100.0)

## 2021-11-06 MED ORDER — PIPERACILLIN-TAZOBACTAM 3.375 G IVPB
3.3750 g | Freq: Three times a day (TID) | INTRAVENOUS | Status: DC
  Administered 2021-11-06 – 2021-11-08 (×7): 3.375 g via INTRAVENOUS
  Filled 2021-11-06 (×11): qty 50

## 2021-11-06 MED ORDER — CEFOXITIN SODIUM 2 G IV SOLR
2.0000 g | Freq: Once | INTRAVENOUS | Status: DC
  Filled 2021-11-06: qty 2

## 2021-11-06 MED ORDER — SODIUM CHLORIDE 0.9 % IV SOLN
250.0000 mL | INTRAVENOUS | Status: DC
  Administered 2021-11-06: 250 mL via INTRAVENOUS

## 2021-11-06 MED ORDER — ACETAMINOPHEN 325 MG PO TABS
ORAL_TABLET | ORAL | Status: AC
  Filled 2021-11-06: qty 3

## 2021-11-06 MED ORDER — LACTATED RINGERS IV BOLUS
1000.0000 mL | Freq: Once | INTRAVENOUS | Status: AC
  Administered 2021-11-06: 1000 mL via INTRAVENOUS

## 2021-11-06 MED ORDER — CHLORHEXIDINE GLUCONATE CLOTH 2 % EX PADS
6.0000 | MEDICATED_PAD | Freq: Every day | CUTANEOUS | Status: DC
  Administered 2021-11-07: 6 via TOPICAL

## 2021-11-06 MED ORDER — CHLORHEXIDINE GLUCONATE CLOTH 2 % EX PADS
6.0000 | MEDICATED_PAD | Freq: Every day | CUTANEOUS | Status: DC
  Administered 2021-11-06: 6 via TOPICAL

## 2021-11-06 MED ORDER — KETOROLAC TROMETHAMINE 30 MG/ML IJ SOLN: 15.0000 mg | Freq: Once | INTRAMUSCULAR | Status: DC

## 2021-11-06 MED ORDER — FUROSEMIDE 10 MG/ML IJ SOLN: 10.0000 mg | Freq: Once | INTRAMUSCULAR | Status: AC

## 2021-11-06 MED ORDER — NOREPINEPHRINE 4 MG/250ML-% IV SOLN
2.0000 ug/min | INTRAVENOUS | Status: DC
  Administered 2021-11-06: 2 ug/min via INTRAVENOUS
  Filled 2021-11-06: qty 250

## 2021-11-06 MED ORDER — FUROSEMIDE 10 MG/ML IJ SOLN
INTRAMUSCULAR | Status: AC
  Administered 2021-11-06: 10 mg via INTRAVENOUS
  Filled 2021-11-06: qty 2

## 2021-11-06 MED ORDER — KETOROLAC TROMETHAMINE 30 MG/ML IJ SOLN
INTRAMUSCULAR | Status: AC
  Filled 2021-11-06: qty 1

## 2021-11-06 MED ORDER — IOHEXOL 350 MG/ML SOLN: 100.0000 mL | Freq: Once | INTRAVENOUS | Status: DC | PRN

## 2021-11-06 MED ORDER — ACETAMINOPHEN 10 MG/ML IV SOLN
1000.0000 mg | Freq: Once | INTRAVENOUS | Status: AC
  Administered 2021-11-06: 1000 mg via INTRAVENOUS
  Filled 2021-11-06: qty 100

## 2021-11-06 MED ORDER — ACETAMINOPHEN 500 MG PO TABS
1000.0000 mg | ORAL_TABLET | Freq: Once | ORAL | Status: DC
Start: 2021-11-06 — End: 2021-11-06

## 2021-11-06 NOTE — Progress Notes (Signed)
Late note entry due to deliveries.  Called to patient's room by nurse regarding patient's clinical status.  Nurse reports that the patient is tachypneic as well as hypotensive.  She is on nonrebreather and satting in the 80s.  On arrival patient has increased work of breathing.  Called attending into the room.  CXR, CT PE study, CBC, CMP, fibrinogen, PT INR, APTT, troponin, BNP ordered.    Reassessment of patient showing improved patient's status with tachypnea resolving.  Patient remains on nonrebreather O2 sat 100%.  Febrile to 102.9.  Tachycardic in the low 100s.  Blood pressure 90s/40s.  Instructed additional liter of fluid bolus.  Rapid response at bedside during evaluation.  Will continue to monitor closely and await further test results.

## 2021-11-06 NOTE — Consult Note (Signed)
NAME:  Mary Levine, MRN:  [id number], DOB:  1985/07/10, LOS: 1 ADMISSION DATE:  11/05/2021, CONSULTATION DATE:  8/20 REFERRING MD: Ilda Basset REASON FOR CONSULT: Hypotension   History of Present Illness:  Mary Levine is an 36 y.o. F who presented to [facility] on 8/19 for preterm labor in the third trimester.  She delivered vaginally on 8/20. Per OB notes the placenta was manually extracted, and on inspection appeared to be intact.   S/P delivery she became hypotensive with a BP as low as 65/30. TMAX 103.1. She developed SOB and was placed on NRB. She was given a total of 3L IVF. Zosyn was started. CTA PE ordered. Lactate 5.1.  PCCM was consulted for hypotension.  Pertinent  Medical History  No pertinent past medical hx  Significant Hospital Events: Including procedures, antibiotic start and stop dates in addition to other pertinent events   8/19 Presented to [facility] for preterm labor 8/20 delivery, hypotension, Zosyn>, CTA chest>   Interim History / Subjective:  See above  Spanish speaking RN Magda Paganini used for exam due to acuity and emergency. Translator ipad non functional.   Subjective: denies chest pain, denies SOB  Objective   Blood pressure (!) 104/51, pulse 91, temperature (!) 101.7 F (38.7 C), temperature source Axillary, resp. rate (!) 28, last menstrual period 11/14/2020, SpO2 99 %. 4L         Intake/Output Summary (Last 24 hours) at 11/06/2021 0618 Last data filed at 11/06/2021 0255 Gross per 24 hour  Intake 6060.6 ml  Output 2771 ml  Net 3289.6 ml   There were no vitals filed for this visit.  Examination: General: In bed, NAD, appears comfortable HEENT: MM pink/moist, anicteric, atraumatic Neuro: RASS 0, PERRL 39m, GCS 15, A&O, MAE CV: S1S2, NSR, no m/r/g appreciated PULM:  clear in the upper lobes, clear in the lower lobes, trachea midline, chest expansion symmetric GI/gu: rounded, bsx4 hypoactive,  soft fundus per LD RN, small vaginal bleeding,  abdominal Extremities: warm/dry, no pretibial edema, capillary refill less than 3 seconds  Skin: no rashes or lesions noted  Labs/imaging NA 131 Albumin 2.4 Alk phos 139 Fibrinogen greater than 800, inr 1.0 BNP WNL, troponin 10 HGB 12.0   Resolved Hospital Problem list     Assessment & Plan:  Shock, query septic shock BP as low as 65/30. TMAX 103.1. Lactate 5.1. No leukocytosis. Per OB notes the placenta was manually extracted, and on inspection appeared to be intact. 30 cc/kg completed. BNP WNL, troponin 10. HGB 12.0 Do not think PE with normal troponin and BNP. 500 EBL -Transfer to ICU -Goal MAP 65. Start peripheral levophed. Titrate to goal -Cefoxitin broadened to Zosyn, continue -Obtain stat ECHO, stat dopplers.  -Obtain blood cultures, pct -Wean SPO2, goal 92-98%, wean to goal -Trend coags, hgb  Hyponatremia Suspect secondary to volume status -trend on bmp  Abdominal pain -OB ordered UKoreapelvis transabdominal -monitor -ABX as above  Preterm labor s/p delivery -Per OB  Best Practice (right click and "Reselect all SmartList Selections" daily)   Diet/type: NPO DVT prophylaxis: SCD GI prophylaxis: PPI Lines: N/A Foley:  Yes, and it is still needed Code Status:  full code Last date of multidisciplinary goals of care discussion [pending]  Labs   CBC: Recent Labs  Lab 11/05/21 1453 11/06/21 0410  WBC 12.8* 7.7  HGB 11.9* 12.0  HCT 36.3 35.9*  MCV 91.0 89.5  PLT 331 3213   Basic Metabolic Panel: Recent Labs  Lab 11/06/21  0410  NA 131*  K 4.7  CL 98  CO2 21*  GLUCOSE 113*  BUN <5*  CREATININE 0.83  CALCIUM 7.5*   GFR: CrCl cannot be calculated (Unknown ideal weight.). Recent Labs  Lab 11/05/21 1453 11/06/21 0410 11/06/21 0417  WBC 12.8* 7.7  --   LATICACIDVEN  --   --  5.1*    Liver Function Tests: Recent Labs  Lab 11/06/21 0410  AST 29  ALT 17  ALKPHOS 139*  BILITOT 1.2  PROT 6.2*  ALBUMIN 2.4*   No results for input(s):  "LIPASE", "AMYLASE" in the last 168 hours. No results for input(s): "AMMONIA" in the last 168 hours.  ABG No results found for: "PHART", "PCO2ART", "PO2ART", "HCO3", "TCO2", "ACIDBASEDEF", "O2SAT"   Coagulation Profile: Recent Labs  Lab 11/06/21 0410  INR 1.0    Cardiac Enzymes: No results for input(s): "CKTOTAL", "CKMB", "CKMBINDEX", "TROPONINI" in the last 168 hours.  HbA1C: No results found for: "HGBA1C"  CBG: No results for input(s): "GLUCAP" in the last 168 hours.  Review of Systems:   Positives in bold  Gen: fever, chills, weight change, fatigue, night sweats HEENT:  blurred vision, double vision, hearing loss, tinnitus, sinus congestion, rhinorrhea, sore throat, neck stiffness, dysphagia PULM:  shortness of breath, cough, sputum production, hemoptysis, wheezing CV: chest pain, edema, orthopnea, paroxysmal nocturnal dyspnea, palpitations GI:  abdominal pain, nausea, vomiting, diarrhea, hematochezia, melena, constipation, change in bowel habits GU: dysuria, hematuria, polyuria, oliguria, urethral discharge Endocrine: hot or cold intolerance, polyuria, polyphagia or appetite change Derm: rash, dry skin, scaling or peeling skin change Heme: easy bruising, bleeding, bleeding gums Neuro: headache, numbness, weakness, slurred speech, loss of memory or consciousness   Past Medical History:  She,  has a past medical history of Medical history non-contributory.   Surgical History:   Past Surgical History:  Procedure Laterality Date   HERNIA REPAIR       Social History:   reports that she has never smoked. She has never used smokeless tobacco. She reports that she does not drink alcohol and does not use drugs.   Family History:  Her family history is not on file.   Allergies Allergies  Allergen Reactions   Shrimp (Diagnostic) Anaphylaxis     Home Medications  Prior to Admission medications   Medication Sig Start Date End Date Taking? Authorizing Provider   aspirin EC 81 MG tablet Take 81 mg by mouth daily. Swallow whole.   Yes [provider]  NIFEdipine (PROCARDIA) 10 MG capsule Take 1 capsule (10 mg total) by mouth every 4 (four) hours as needed (contractions). 10/28/21  Yes Florian Buff, MD  Prenatal Vit-Fe Fumarate-FA (PRENATAL MULTIVITAMIN) TABS tablet Take 1 tablet by mouth daily at 12 noon.   Yes [provider]     Critical care time: 37 minutes    Redmond School., MSN, APRN, AGACNP-BC [facility]  11/06/2021 , 6:19 AM  Please see [email or web address] for pager details  If no response, please call [phone] After hours, please call Elink at [phone]

## 2021-11-06 NOTE — Progress Notes (Signed)
Bilateral lower extremity venous duplex has been completed. Preliminary results can be found in CV Proc through chart review.   11/06/21 12:02 PM Mary Levine RVT

## 2021-11-06 NOTE — Consult Note (Signed)
Mary Levine is a 36 y.o. female admitted on 11/05/2021 12:12 PM  with sepsis. Pharmacy has been consulted for Zosyn dosing.  Plan: Zosyn 3.375g IV q8h (4 hour infusion).  Ht Readings from Last 1 Encounters:  10/26/21 '5\' 2"'$  (1.575 m)       Patient weight not recorded   Temp: 102.1 F (38.9 C) (08/20 0358) Temp Source: Axillary (08/20 0358) BP: 106/45 (08/20 0317) Pulse Rate: 134 (08/20 0317)   Recent Labs    11/05/21 1453  WBC 12.8*   CrCl cannot be calculated (Patient's most recent lab result is older than the maximum 21 days allowed.).  Allergies: Shrimp (diagnostic)   Antimicrobials this admission: PCN GBS px 8/19 >> 8/20  Thank you for allowing pharmacy to be a part of this patient's care.  Burns Spain 11/06/2021 4:04 AM

## 2021-11-06 NOTE — Progress Notes (Addendum)
OB Note Called to see patient due to abdominal pain  The patient points to the suprapubic area. Fundus and lower uterine segment feel firm and no bleeding. IV toradol x 1 ordered. Formal u/s ordered to assess for any retained POCs. Foley has been in place since before delivery.   pCXR negative. Pt about to go down for CT PE scan. CCU on board to help with management and Levophed is infusing currently. Labs unremarkable save for a lactic acid of 5  Patient Vitals for the past 6 hrs:  BP Temp Temp src Pulse Resp SpO2  11/06/21 0639 -- 100.1 F (37.8 C) Axillary -- -- --  11/06/21 0619 (!) 89/58 -- -- 90 -- --  11/06/21 0601 (!) 99/48 -- -- 89 -- 100 %  11/06/21 0551 -- (!) 101.7 F (38.7 C) Axillary -- -- 99 %  11/06/21 0550 -- -- -- -- -- 99 %  11/06/21 0545 (!) 104/51 -- -- 91 -- 99 %  11/06/21 0540 -- -- -- -- -- 98 %  11/06/21 0535 (!) 72/44 -- -- 92 -- --  11/06/21 0522 -- (!) 102.8 F (39.3 C) Axillary -- -- --  11/06/21 0516 (!) 96/45 -- -- 97 -- --  11/06/21 0510 (!) 93/48 (!) 102.9 F (39.4 C) Axillary (!) 101 (!) 28 100 %  11/06/21 0500 (!) 85/35 -- -- (!) 110 (!) 28 100 %  11/06/21 0455 (!) 95/42 -- -- (!) 112 -- 100 %  11/06/21 0447 (!) 94/36 -- -- (!) 114 -- --  11/06/21 0442 -- (!) 103.1 F (39.5 C) Oral -- (!) 32 100 %  11/06/21 0416 (!) 129/98 -- -- (!) 107 -- --  11/06/21 0403 119/60 -- -- (!) 125 -- --  11/06/21 0358 -- (!) 102.1 F (38.9 C) Axillary -- -- --  11/06/21 0317 (!) 106/45 -- -- (!) 134 -- --  11/06/21 0300 -- -- -- -- -- 90 %  11/06/21 0210 -- -- -- -- -- 95 %  11/06/21 0205 -- -- -- -- -- 94 %  11/06/21 0201 (!) 103/53 -- -- 96 -- --  11/06/21 0200 -- -- -- -- -- 94 %  11/06/21 0155 -- -- -- -- -- 96 %  11/06/21 0150 -- -- -- -- -- 96 %  11/06/21 0149 -- -- -- -- -- 96 %  11/06/21 0145 -- -- -- -- -- 94 %  11/06/21 0140 -- -- -- -- -- 96 %  11/06/21 0131 (!) 101/54 -- -- 100 -- --  11/06/21 0130 -- -- -- -- -- 96 %  11/06/21 0125 -- -- -- --  -- 97 %  11/06/21 0120 -- -- -- -- -- 95 %  11/06/21 0115 -- -- -- -- -- 99 %  11/06/21 0110 -- -- -- -- -- 99 %  11/06/21 0100 119/76 99.8 F (37.7 C) Oral (!) 107 17 98 %  11/06/21 0055 -- -- -- -- -- 96 %  11/06/21 0050 -- -- -- -- -- 95 %        Durene Romans MD Attending [facility] for [place] (Faculty Practice) 11/06/2021 Time: [phone]

## 2021-11-06 NOTE — Progress Notes (Signed)
NAME:  Mary Levine, MRN:  [id number], DOB:  07-16-1985, LOS: 1 ADMISSION DATE:  11/05/2021, CONSULTATION DATE:  8/20 REFERRING MD: Ilda Basset REASON FOR CONSULT: Hypotension   History of Present Illness:  Mary Levine is an 36 y.o. F who presented to [facility] on 8/19 for preterm labor in the third trimester.  She delivered vaginally on 8/20. Per OB notes the placenta was manually extracted, and on inspection appeared to be intact.   S/P delivery she became hypotensive with a BP as low as 65/30. TMAX 103.1. She developed SOB and was placed on NRB. She was given a total of 3L IVF. Zosyn was started. CTA PE ordered but canceled with negative troponin. Lactate 5.1 > cleared to 1.1.  PCCM was consulted for hypotension.  Pertinent  Medical History  No pertinent past medical hx  Significant Hospital Events: Including procedures, antibiotic start and stop dates in addition to other pertinent events   8/19 Presented to [facility] for preterm labor 8/20 delivery, hypotension, Zosyn, tx to ICU, ECHO w/ LVEF 60-65%   Interim History / Subjective:  Jose, RN - assisted with translation  Tmax 101.1 / WBC Remains on 87mg levophed  Pt reports cramping   Objective   Blood pressure (!) 96/54, pulse 77, temperature 100.1 F (37.8 C), temperature source Axillary, resp. rate 20, last menstrual period 11/14/2020, SpO2 94 %, unknown if currently breastfeeding. 4L         Intake/Output Summary (Last 24 hours) at 11/06/2021 0839 Last data filed at 11/06/2021 0300 Gross per 24 hour  Intake 6210.93 ml  Output 2871 ml  Net 3339.93 ml   There were no vitals filed for this visit.  Examination: General: adult female lying in bed in NAD   HEENT: MM pink/moist, [place] O2 Neuro: AAOx4, speech clear, MAE / normal strength  CV: s1s2 RRR, no m/r/g PULM: non-labored at rest, lungs bilaterally clear with good air entry  GI: soft, bsx4 active  Extremities: warm/dry, no edema, cap refill <3 sec Skin: no rashes  or lesions GYN: fundus firm, 1cm below umbilicus per LD RN, small amt red vaginal bleeding  CXR > clear  ECHO > LVEF 60-65%, no RWMA, normal PA systolic pressure, RA pressure 8 Limited UKoreaPelvis >> 10 cm fibroid  Resolved Hospital Problem list     Assessment & Plan:   Shock, query septic shock BP as low as 65/30. TMAX 103.1. Lactate 5.1. No leukocytosis. Per OB notes the placenta was manually extracted, and on inspection appeared to be intact. 30 cc/kg completed. BNP WNL, troponin 10. HGB 12.0 Do not think PE with normal troponin and BNP. 600 EBL.  Hgb 12 > 10.  ECHO reassuring, essentially normal.   -monitor in ICU  -appreciate L&D RN and OB team  -wean levophed for MAP >65 -continue zosyn -trend PCT  -follow up blood cultures  -follow up labs at 1pm   Acute Respiratory Failure with Hypoxia  Suspect in setting of gravid uterus, flat positioning, degree of physiologic shunt -wean O2 for sats >90% -assess Mg+ now  Hyponatremia Suspect secondary to volume status -follow up labs as above   Abdominal Pain / Cramping post Vaginal Delivery  R/o Chorioamnionitis or Endometritis  OB concerned about chorioamnionitis after delivery, zosyn initiated. Limited UKoreawith large fibroid. -supportive care  -abx as above   Preterm Labor s/p Delivery -Per OB, appreciate assistance with patient care  Best Practice (right click and "Reselect all SmartList Selections" daily)  Diet/type: NPO  DVT prophylaxis: SCD GI prophylaxis: PPI Lines: N/A Foley:  Yes, and it is still needed Code Status:  full code Last date of multidisciplinary goals of care discussion: full code. Patient updated on plan of care 8/20 via RN.   Critical care time: 34 minutes    Noe Gens, MSN, APRN, NP-C, AGACNP-BC [facility] 11/06/2021, 8:40 AM   Please see [email or web address] for pager details.   From 7A-7P if no response, please call [phone] After hours, please call ELink  [phone]

## 2021-11-06 NOTE — Progress Notes (Signed)
OB Note SROM per RN just now with clear fluid.  SVE by Dr. Caron Presume still 7 but baby feels lower. Category I, toco not tracing well. Mom looks mildly uncomfortable A/p: pt stable Anticipate SVD. NICU for delivery  Durene Romans MD Attending Center for [employer] (Faculty Practice) 11/06/2021 Time: [phone]

## 2021-11-06 NOTE — Lactation Note (Signed)
This note was copied from a baby's chart. Lactation Consultation Note  Patient Name: Mary Levine CHYIF'O Date: 11/06/2021 Reason for consult: Initial assessment;1st time breastfeeding;Preterm <34wks See Birth Parent MR. Current RX (Zosyn) is compatible with BF-L2 Age:36 hours P4, Preterm infant in NICU and Birth Parent in ICU. Support Person used as Therapist, occupational, [place] discussed with Birth Parent to pump every 3 hours for 15 minutes on initial setting. LC discussed the importance of using DEBP to help stimulate and establish Birth's Parent milk supply. Birth Parent was using DEBP with 27 mm Breast flange  when LC was in the room. Birth Parent and Support Person was  shown how to use DEBP & how to disassemble, clean, & reassemble parts.   [name] [facility] and Support Person how to transport any EBM to NICU, discussing labels, bottles and time Support Person would need take EBM that is pumped to NICU. [name] [facility] will follow up with  Birth Parent tomorrow.  Maternal Data Does the patient have breastfeeding experience prior to this delivery?: Yes How long did the patient breastfeed?: Per Birth Parent, other 3 children was BF for 2 months each, 3rd child is currently 30 years old.  Feeding    LATCH Score                    Lactation Tools Discussed/Used Tools: Pump;Flanges Flange Size: 27 Breast pump type: Double-Electric Breast Pump Pump Education: Setup, frequency, and cleaning;Milk Storage Reason for Pumping: Birth Parent and infant are seperate, Birth Parent in ICU, to help stimulate and establish milk supply. Pumping frequency: Pump every 3 hours for 15 minutes on inital setting.  Interventions Interventions: Breast feeding basics reviewed;Expressed milk;Education;LC Services brochure  Discharge    Consult Status Consult Status: Follow-up Date: 11/07/21 Follow-up type: In-patient    [name] 11/06/2021, 4:25 PM

## 2021-11-06 NOTE — Significant Event (Addendum)
Rapid Response Event Note   Reason for Call :  New onset SOB/hypoxia/CP approx 1 hour after vaginal delivery.  Per RN, pt became tachycardic, tachpneic, and dropped SpO2 to 70s on 2L [place]. She was placed on NRB with SpO2 increasing to 100%  Initial Focused Assessment:  Pt lying in bed with eyes closed. She is alert and oriented, c/o L sided chest pain and SOB. Lungs CTA, ABD soft/distended. Skin pale, hot to touch, diaphoretic.  T-103, HR-120, BP-129/98, RR-40s, SpO2-100% on NRB  Interventions:  PCXR-negative EKG-ST CBC CMP PT/INR-13.4/1.0 PTT-30 BNP-29.2 Trop-10 LA-5.1>additional 1L LR bolus given Fibrinogen ABG Zosyn Tylenol 1g IV Lasix '10mg'$  IV CTA chest-r/o PE 500cc LR bolus(BP-96/36), additional 1L given for LA-5.1 and BP-85/35 PCCM consulted, levo ordered and started, tx to ICU>SBP-70s Plan of Care:  Pt better after interventions however BP low. Levo started and will tx to 3M04.  Update: Pt began to c/o extreme ABD pain. Dr. Ilda Basset back to beside. '15mg'$  toradol given and STAT pelvic U/S ordered. Will tx to 3M04 once U/S complete.   Event Summary:   MD Notified: Dr. Ilda Basset and Dr. Dorothe Pea notified and at beside, [facility] Ramaswamy/George consulted. Call [place], George Alcantar Anderson, RN

## 2021-11-06 NOTE — Lactation Note (Signed)
This note was copied from a baby's chart. Lactation Consultation Note  Patient Name: Mary Levine BWIOM'B Date: 11/06/2021   Age:36 hours  Initial Lactation Consult:  Attempted to visit with family and initiate the electric pump for birth parent.  Spoke with RN prior to entering the room.  RN took me into her room to verify that she desired to begin pumping.  Birth parent and support person asleep.  Informed RN that he may call later for follow up if birth parent desires.   Maternal Data    Feeding    LATCH Score                    Lactation Tools Discussed/Used    Interventions    Discharge    Consult Status      Tarini Carrier R Rondrick Barreira 11/06/2021, 2:13 PM

## 2021-11-06 NOTE — Progress Notes (Signed)
  Echocardiogram 2D Echocardiogram has been performed.  Mary Levine 11/06/2021, 8:26 AM

## 2021-11-06 NOTE — Progress Notes (Signed)
Mary Levine [id number] Admission Data: 11/06/2021 9:58 AM Attending Provider: Donnamae Jude, MD  PCP:Pcp, No Consults/ Treatment Team:   Beatrix Fetters [name] is a 36 y.o. female patient admitted from ED awake, alert  & orientated  X 4,  Full Code, VSS - Blood pressure (!) 101/57, pulse 80, temperature 100.1 F (37.8 C), temperature source Axillary, resp. rate 19, last menstrual period 11/14/2020, SpO2 95 %, unknown if currently breastfeeding., , no c/o shortness of breath, no c/o chest pain, no distress noted. Tele # [id number] placed and pt is currently running:normal sinus rhythm. No bleed or vaginal discharge noted.    IV site WDL:  forearm right, condition patent and no redness and left, condition patent and no redness with a transparent dsg that's clean dry and intact.   Pt orientation to unit, room and routine. Information packet given to patient/family and safety video watched.  Admission INP armband ID verified with patient/family, and in place. SR up x 2, fall risk assessment complete with Patient and family verbalizing understanding of risks associated with falls. Pt verbalizes an understanding of how to use the call bell and to call for help before getting out of bed.  Skin, clean-dry- intact without evidence of bruising, or skin tears.   No evidence of skin break down noted on exam.      Will cont to monitor and assist as needed.  Arnaldo Heffron Shelda Pal, RN 11/06/2021 9:58 AM

## 2021-11-06 NOTE — Progress Notes (Signed)
OB Note  Late entry due to deliveries on L&D. Patient delivered approximatley 45 minutes ago w/ only complication being needing manual extraction of placenta due to cord starting to avulse. When manual removal done, weakenss of the placenta at the fibroid which felt most likely submucosal and approximately 10cm.  Placenta out w/o issue and inspected and looked to be intact. Cefoxitin ordered  Currently, I was called to patient's room due to difficulty breathing and needing to be on O2 NRB to maintain her sats in the low 90s. Pt has received the cefoxitin yet and axiliary temp 102. Using ipad interpreter, she notes substernal chest pain. RR called and pCXR, CT PE, cbc, cmp, fibrinogen and coags, troponin and bnp ordered. Will do zosyn instead of cefoxitin.  Lungs didn't sound wet but difficult to do full exam. Will also order a gm of apap and '10mg'$  IV lasix x 1  Patient Vitals for the past 24 hrs:  BP Temp Temp src Pulse Resp SpO2  11/06/21 0358 -- (!) 102.1 F (38.9 C) Axillary -- -- --  11/06/21 0317 (!) 106/45 -- -- (!) 134 -- --  11/06/21 0210 -- -- -- -- -- 95 %  11/06/21 0205 -- -- -- -- -- 94 %  11/06/21 0201 (!) 103/53 -- -- 96 -- --  11/06/21 0200 -- -- -- -- -- 94 %  11/06/21 0155 -- -- -- -- -- 96 %  11/06/21 0150 -- -- -- -- -- 96 %  11/06/21 0149 -- -- -- -- -- 96 %  11/06/21 0145 -- -- -- -- -- 94 %  11/06/21 0140 -- -- -- -- -- 96 %  11/06/21 0131 (!) 101/54 -- -- 100 -- --  11/06/21 0130 -- -- -- -- -- 96 %  11/06/21 0125 -- -- -- -- -- 97 %  11/06/21 0120 -- -- -- -- -- 95 %  11/06/21 0115 -- -- -- -- -- 99 %  11/06/21 0110 -- -- -- -- -- 99 %  11/06/21 0100 119/76 99.8 F (37.7 C) Oral (!) 107 17 98 %  11/06/21 0055 -- -- -- -- -- 96 %  11/06/21 0050 -- -- -- -- -- 95 %  11/06/21 0045 -- -- -- -- -- 92 %  11/06/21 0040 -- -- -- -- -- 93 %  11/06/21 0036 -- -- -- -- -- 95 %  11/06/21 0031 112/77 -- -- (!) 102 -- --  11/06/21 0030 -- -- -- -- 14 92 %  11/06/21 0025  -- -- -- -- -- 94 %  11/06/21 0010 -- -- -- -- -- 96 %  11/06/21 0005 -- -- -- -- -- 96 %  11/06/21 0000 112/70 -- -- 100 13 94 %  11/05/21 2355 -- -- -- -- -- 92 %  11/05/21 2350 -- -- -- -- -- 94 %  11/05/21 2340 -- -- -- -- -- 93 %  11/05/21 2335 -- -- -- -- -- 96 %  11/05/21 2331 117/69 -- -- (!) 109 -- 97 %  11/05/21 2301 117/71 -- -- (!) 102 -- --  11/05/21 2300 -- -- -- -- 16 98 %  11/05/21 2235 -- -- -- -- -- 97 %  11/05/21 2231 117/68 -- -- (!) 113 16 --  11/05/21 2230 -- -- -- -- -- 99 %  11/05/21 2225 -- -- -- -- -- 98 %  11/05/21 2210 -- -- -- -- -- 100 %  11/05/21 2205 -- -- -- -- -- 99 %  11/05/21 2201 118/71 -- -- (!) 102 -- --  11/05/21 2200 -- -- -- -- 17 99 %  11/05/21 2150 -- -- -- -- -- 98 %  11/05/21 2146 115/61 98.7 F (37.1 C) Oral (!) 108 -- --  11/05/21 2100 108/65 -- -- (!) 104 16 98 %  11/05/21 2030 111/67 -- -- (!) 106 -- 97 %  11/05/21 2025 -- -- -- -- -- 100 %  11/05/21 2020 -- -- -- -- -- 100 %  11/05/21 2015 -- -- -- -- -- 100 %  11/05/21 2000 106/65 -- -- 99 15 99 %  11/05/21 1943 -- -- -- -- -- 99 %  11/05/21 1920 -- -- -- -- -- 95 %  11/05/21 1900 (!) 102/51 -- -- (!) 104 19 95 %  11/05/21 1850 -- -- -- -- -- 99 %  11/05/21 1840 -- -- -- -- -- 97 %  11/05/21 1830 (!) 98/55 -- -- (!) 103 -- 95 %  11/05/21 1825 -- -- -- -- -- 100 %  11/05/21 1820 -- -- -- -- -- 99 %  11/05/21 1815 -- -- -- -- -- 97 %  11/05/21 1810 -- -- -- -- -- 99 %  11/05/21 1805 (!) 89/49 -- -- 97 -- 96 %  11/05/21 1800 (!) 118/102 -- -- (!) 136 -- 99 %  11/05/21 1755 (!) 141/126 -- -- (!) 59 -- --  11/05/21 1751 (!) 129/103 -- -- (!) 137 -- --  11/05/21 1750 (!) 129/103 -- -- (!) 137 -- --  11/05/21 1746 108/67 -- -- 99 -- --  11/05/21 1745 -- -- -- -- -- 98 %  11/05/21 1740 (!) 91/35 -- -- (!) 106 -- 99 %  11/05/21 1739 (!) 103/43 -- -- 93 -- --  11/05/21 1734 (!) 92/38 -- -- 81 -- --  11/05/21 1730 (!) 65/30 -- -- 61 20 97 %  11/05/21 1725 -- -- -- -- -- 97 %   11/05/21 1720 (!) 91/31 -- -- 98 -- 98 %  11/05/21 1715 (!) 121/47 -- -- (!) 124 -- 98 %  11/05/21 1706 118/72 -- -- (!) 102 17 --  11/05/21 1640 -- -- -- -- -- 99 %  11/05/21 1635 -- -- -- -- -- 100 %  11/05/21 1630 -- -- -- -- -- 97 %  11/05/21 1625 -- -- -- -- -- 96 %  11/05/21 1620 -- -- -- -- -- 98 %  11/05/21 1615 -- -- -- -- -- 97 %  11/05/21 1610 -- -- -- -- -- 98 %  11/05/21 1605 105/72 -- -- (!) 110 18 100 %  11/05/21 1600 -- -- -- -- -- 98 %  11/05/21 1535 (!) 116/55 -- -- 98 -- --  11/05/21 1530 -- -- -- -- 18 --  11/05/21 1528 (!) 116/55 -- -- (!) 105 -- --  11/05/21 1524 95/76 -- -- (!) 104 -- --  11/05/21 1520 -- -- -- -- 16 --  11/05/21 1512 116/61 -- -- 97 -- --  11/05/21 1510 -- 99.2 F (37.3 C) Oral -- 17 --  11/05/21 1308 109/65 -- -- -- -- --  11/05/21 1228 134/72 98.8 F (37.1 C) Oral (!) 108 18 --     Durene Romans MD Attending Center for [employer] (Faculty Practice) 11/06/2021 Time: 0400

## 2021-11-07 ENCOUNTER — Inpatient Hospital Stay (HOSPITAL_COMMUNITY): Payer: Medicaid Other

## 2021-11-07 LAB — MAGNESIUM: Magnesium: 1.9 mg/dL (ref 1.7–2.4)

## 2021-11-07 LAB — CBC
HCT: 30.6 % — ABNORMAL LOW (ref 36.0–46.0)
HCT: 31.6 % — ABNORMAL LOW (ref 36.0–46.0)
Hemoglobin: 10.3 g/dL — ABNORMAL LOW (ref 12.0–15.0)
Hemoglobin: 10.8 g/dL — ABNORMAL LOW (ref 12.0–15.0)
MCH: 30.4 pg (ref 26.0–34.0)
MCH: 30.9 pg (ref 26.0–34.0)
MCHC: 33.7 g/dL (ref 30.0–36.0)
MCHC: 34.2 g/dL (ref 30.0–36.0)
MCV: 90.3 fL (ref 80.0–100.0)
MCV: 90.5 fL (ref 80.0–100.0)
Platelets: 286 10*3/uL (ref 150–400)
Platelets: 308 10*3/uL (ref 150–400)
RBC: 3.39 MIL/uL — ABNORMAL LOW (ref 3.87–5.11)
RBC: 3.49 MIL/uL — ABNORMAL LOW (ref 3.87–5.11)
RDW: 12.6 % (ref 11.5–15.5)
RDW: 12.6 % (ref 11.5–15.5)
WBC: 13 10*3/uL — ABNORMAL HIGH (ref 4.0–10.5)
WBC: 12.4 10*3/uL — ABNORMAL HIGH (ref 4.0–10.5)
nRBC: 0 % (ref 0.0–0.2)
nRBC: 0 % (ref 0.0–0.2)

## 2021-11-07 LAB — BASIC METABOLIC PANEL
Anion gap: 6 (ref 5–15)
BUN: <5 mg/dL — ABNORMAL LOW (ref 6–20)
CO2: 23 mmol/L (ref 22–32)
Calcium: 8.2 mg/dL — ABNORMAL LOW (ref 8.9–10.3)
Chloride: 108 mmol/L (ref 98–111)
Creatinine, Ser: 0.59 mg/dL (ref 0.44–1.00)
GFR, Estimated: >60 mL/min (ref >60)
Glucose, Bld: 81 mg/dL (ref 70–99)
Potassium: 4.1 mmol/L (ref 3.5–5.1)
Sodium: 137 mmol/L (ref 135–145)

## 2021-11-07 LAB — GLUCOSE, CAPILLARY: Glucose-Capillary: 79 mg/dL (ref 70–99)

## 2021-11-07 LAB — PROCALCITONIN: Procalcitonin: 4.07 ng/mL

## 2021-11-07 MED ORDER — BENZOCAINE-MENTHOL 20-0.5 % EX AERO: 1.0000 | INHALATION_SPRAY | CUTANEOUS | Status: DC | PRN

## 2021-11-07 MED ORDER — DIPHENHYDRAMINE HCL 25 MG PO CAPS: 25.0000 mg | ORAL_CAPSULE | Freq: Four times a day (QID) | ORAL | Status: DC | PRN

## 2021-11-07 MED ORDER — PRENATAL MULTIVITAMIN CH
1.0000 | ORAL_TABLET | Freq: Every day | ORAL | Status: DC
  Administered 2021-11-07: 1 via ORAL
  Filled 2021-11-07: qty 1

## 2021-11-07 MED ORDER — TETANUS-DIPHTH-ACELL PERTUSSIS 5-2.5-18.5 LF-MCG/0.5 IM SUSY
0.5000 mL | PREFILLED_SYRINGE | Freq: Once | INTRAMUSCULAR | Status: AC
  Administered 2021-11-07: 0.5 mL via INTRAMUSCULAR
  Filled 2021-11-07: qty 0.5

## 2021-11-07 MED ORDER — ONDANSETRON HCL 4 MG/2ML IJ SOLN: 4.0000 mg | INTRAMUSCULAR | Status: DC | PRN

## 2021-11-07 MED ORDER — IBUPROFEN 600 MG PO TABS
600.0000 mg | ORAL_TABLET | Freq: Four times a day (QID) | ORAL | Status: DC
  Administered 2021-11-07 – 2021-11-08 (×4): 600 mg via ORAL
  Filled 2021-11-07: qty 1
  Filled 2021-11-07: qty 3
  Filled 2021-11-07 (×2): qty 1

## 2021-11-07 MED ORDER — ACETAMINOPHEN 325 MG PO TABS: 650.0000 mg | ORAL_TABLET | ORAL | Status: DC | PRN

## 2021-11-07 MED ORDER — SENNOSIDES-DOCUSATE SODIUM 8.6-50 MG PO TABS
2.0000 | ORAL_TABLET | ORAL | Status: DC
  Administered 2021-11-07: 2 via ORAL
  Filled 2021-11-07: qty 2

## 2021-11-07 MED ORDER — ZOLPIDEM TARTRATE 5 MG PO TABS: 5.0000 mg | ORAL_TABLET | Freq: Every evening | ORAL | Status: DC | PRN

## 2021-11-07 MED ORDER — ONDANSETRON HCL 4 MG PO TABS: 4.0000 mg | ORAL_TABLET | ORAL | Status: DC | PRN

## 2021-11-07 MED ORDER — SIMETHICONE 80 MG PO CHEW: 80.0000 mg | CHEWABLE_TABLET | ORAL | Status: DC | PRN

## 2021-11-07 MED ORDER — COCONUT OIL OIL: 1.0000 | TOPICAL_OIL | Status: DC | PRN

## 2021-11-07 MED ORDER — DIBUCAINE (PERIANAL) 1 % EX OINT: 1.0000 | TOPICAL_OINTMENT | CUTANEOUS | Status: DC | PRN

## 2021-11-07 MED ORDER — WITCH HAZEL-GLYCERIN EX PADS: 1.0000 | MEDICATED_PAD | CUTANEOUS | Status: DC | PRN

## 2021-11-07 NOTE — Progress Notes (Signed)
Discussed case with nurse. Off pressors, resting comfortably. Agree more c/w septic shock, continue abx, follow fever/WBC curve.  Repeat imaging per OB service.  Available PRN  Erskine Emery MD PCCM

## 2021-11-07 NOTE — Lactation Note (Signed)
This note was copied from a baby's chart. Lactation Consultation Note Maternal patient with recent transfer from ICU to [facility]. Per RN, she is pumping and declines LC visit at this time. LC will plan f/u tomorrow.   Patient Name: Mary Levine FHLKT'G Date: 11/07/2021   Age:36 hours  [name] 11/07/2021, 3:10 PM

## 2021-11-07 NOTE — Progress Notes (Signed)
Post Partum Day 1 s/p SVD complicated by sepsis due to severe chorioamnionitis Subjective: no complaints and tolerating PO. She denies any pain. She reports minimal bleeding. She still has a foley in place.   Objective: Blood pressure 130/83, pulse 81, temperature 97.7 F (36.5 C), temperature source Axillary, resp. rate 15, last menstrual period 11/14/2020, SpO2 93 %, unknown if currently breastfeeding.  Physical Exam:  General: alert, cooperative, and no distress Lochia: appropriate Uterine Fundus: firm Incision: n/a DVT Evaluation: No evidence of DVT seen on physical exam. No cords or calf tenderness. No significant calf/ankle edema.     Latest Ref Rng & Units 11/07/2021    9:23 AM 11/07/2021   12:45 AM 11/06/2021    1:07 PM  CBC  WBC 4.0 - 10.5 K/uL 12.4  13.0  15.4   Hemoglobin 12.0 - 15.0 g/dL 10.8  10.3  10.5   Hematocrit 36.0 - 46.0 % 31.6  30.6  31.0   Platelets 150 - 400 K/uL 308  286  279        Latest Ref Rng & Units 11/07/2021   12:45 AM 11/06/2021    1:07 PM 11/06/2021    8:19 AM  BMP  Glucose 70 - 99 mg/dL 81  146    BUN 6 - 20 mg/dL <5  <5    Creatinine 0.44 - 1.00 mg/dL 0.59  0.74    Sodium 135 - 145 mmol/L 137  136  134   Potassium 3.5 - 5.1 mmol/L 4.1  3.5  3.4   Chloride 98 - 111 mmol/L 108  108    CO2 22 - 32 mmol/L 23  19    Calcium 8.9 - 10.3 mg/dL 8.2  7.8     Lactic Acid 5.1>1.1   Assessment/Plan: Sepsis/IAI - Continue Zosyn - Blood cultures Ng1d - Last fever 8/20 at 6am - 101.91F - WBC improved from 15.4 to 12.4 this morning.  - Foley removed  Postpartum - Contraception: Ultimate plan is for BTL, but will discuss at [facility] visit.  - MOF: Breast - Rh status: Positive - Rubella status: Immune - Dispo: Possible D/C tomorrow - pt is anxious for d/c due to 3 other children at home.  - Consults: ICU - being released from ICU to [facility] today.  - Counseled on Tdap - pt accepts and given in ICU  Neonatal - Doing well in NICU per pt. She is eager to  see her.   Spanish speaking patient Michelene Heady #[id number] video interpreter used.    LOS: 2 days   Radene Gunning 11/07/2021, 10:18 AM

## 2021-11-07 NOTE — Progress Notes (Deleted)
Patient with brown old blood looking emesis after trying to drink clear liquids .Zofran given and Dr Tamala Julian informed . Will continue to monitor

## 2021-11-07 NOTE — Discharge Summary (Signed)
Postpartum Discharge Summary  Date of Service updated***     Patient Name: Mary Levine DOB: 23-Aug-1985 MRN: [id number]  Date of admission: 11/05/2021 Delivery date:11/06/2021  Delivering provider: Aletha Halim  Date of discharge: 11/07/2021  Admitting diagnosis: Preterm labor [O60.00] Intrauterine pregnancy: [redacted]w[redacted]d    Secondary diagnosis:  Principal Problem:   Preterm labor in third trimester Active Problems:   Preterm labor   Severe sepsis ([place]   Shock circulatory ([place]   Placenta succenturiate lobe affecting fetus   Uterine fibroid during pregnancy, antepartum   Language barrier  Additional problems: ***    Discharge diagnosis: Preterm Pregnancy Delivered                                              Post partum procedures:*** Augmentation: N/A Complications: None  Hospital course: Onset of Labor With Vaginal Delivery      36y.o. yo [id number] admitted in Active Labor on 11/05/2021. Patient had an uncomplicated labor course as follows:  Membrane Rupture Time/Date: 2:25 AM ,11/06/2021   Delivery Method:Vaginal, Spontaneous  Episiotomy: None  Lacerations:  None  Patient had a postpartum course complicated by severe sepsis likely due to chorioamnionitis (which would also explain her unprecedented preterm birth).  She was hypotensive and admitted to the ICU for pressors and IV antibiotics. She responded quickly such that she was off pressors on PPD#0 and [facility]. She was on Zosyn for her antibiotics and remained afebrile soon after starting the antibiotics (last fever 8/20 at 6am). *** She is ambulating, tolerating a regular diet, passing flatus, and urinating well. Patient is discharged home in stable condition on 11/07/21.  Newborn Data: Birth date:11/06/2021  Birth time:2:55 AM  Gender:Female  Living status:Living  Apgars:7 ,9  Weight:1390 g   Magnesium Sulfate received: Yes: Neuroprotection BMZ received:  Yes Rhophylac:N/A MMR:N/A T-DaP:Given postpartum Flu: N/A Transfusion:{Transfusion received:30440034}  Physical exam  Vitals:   11/07/21 0700 11/07/21 0800 11/07/21 0900 11/07/21 1151  BP: (!) 88/42 103/69 130/83 110/60  Pulse: 66 86 81 80  Resp: 16 17 15    Temp:  97.7 F (36.5 C)    TempSrc:  Axillary    SpO2: 91% 93% 93% 95%   General: alert, cooperative, and no distress Lochia: appropriate Uterine Fundus: firm Incision: N/A DVT Evaluation: No evidence of DVT seen on physical exam. No cords or calf tenderness. Labs: Lab Results  Component Value Date   WBC 12.4 (H) 11/07/2021   HGB 10.8 (L) 11/07/2021   HCT 31.6 (L) 11/07/2021   MCV 90.5 11/07/2021   PLT 308 11/07/2021      Latest Ref Rng & Units 11/07/2021   12:45 AM  CMP  Glucose 70 - 99 mg/dL 81   BUN 6 - 20 mg/dL <5   Creatinine 0.44 - 1.00 mg/dL 0.59   Sodium 135 - 145 mmol/L 137   Potassium 3.5 - 5.1 mmol/L 4.1   Chloride 98 - 111 mmol/L 108   CO2 22 - 32 mmol/L 23   Calcium 8.9 - 10.3 mg/dL 8.2    Edinburgh Score:     No data to display            After visit meds:  Allergies as of 11/07/2021       Reactions  Shrimp (diagnostic) Anaphylaxis     Med Rec must be completed prior to using this [facility]***        Discharge home in stable condition Infant Feeding: Breast Infant Disposition:NICU Discharge instruction: per After Visit Summary and Postpartum booklet. Activity: Advance as tolerated. Pelvic rest for 6 weeks.  Diet: routine diet Anticipated Birth Control: {Birth Control:23956} Postpartum Appointment:4 weeks Additional Postpartum F/U: *** Future Appointments:No future appointments. Follow up Visit:  Follow-up Information     Department, [facility] Follow up in 4 week(s).   Contact information: [address] [place] [address] [phone]                     11/07/2021 Radene Gunning, MD

## 2021-11-07 NOTE — Anesthesia Postprocedure Evaluation (Signed)
Anesthesia Post Note  Patient: Mary Levine  Procedure(s) Performed: AN AD HOC LABOR EPIDURAL     Patient location during evaluation: Other Anesthesia Type: Epidural Level of consciousness: awake, awake and alert and oriented Pain management: pain level controlled Vital Signs Assessment: post-procedure vital signs reviewed and stable Respiratory status: spontaneous breathing, nonlabored ventilation and respiratory function stable Cardiovascular status: stable Postop Assessment: no headache, patient able to bend at knees, no apparent nausea or vomiting and adequate PO intake (Patient has not ambulated yet but numbness is gone and can bend legs.) Anesthetic complications: no   No notable events documented.  Last Vitals:  Vitals:   11/07/21 0700 11/07/21 0800  BP: (!) 88/42 103/69  Pulse: 66 86  Resp: 16 17  Temp:    SpO2: 91% 93%    Last Pain:  Vitals:   11/07/21 0800  TempSrc:   PainSc: 0-No pain   Pain Goal:                   Gerardo Caiazzo

## 2021-11-07 NOTE — Progress Notes (Signed)
Patient was transferred to [facility] at [employer] via wheelchair .We also took her to NICU to see her baby with NICU RN and interpreter services at the bedside.

## 2021-11-08 ENCOUNTER — Other Ambulatory Visit (HOSPITAL_COMMUNITY): Payer: Self-pay

## 2021-11-08 LAB — SURGICAL PATHOLOGY

## 2021-11-08 MED ORDER — IBUPROFEN 600 MG PO TABS
600.0000 mg | ORAL_TABLET | Freq: Four times a day (QID) | ORAL | 0 refills | Status: AC
  Filled 2021-11-08: qty 30, 8d supply, fill #0

## 2021-11-08 MED ORDER — ACETAMINOPHEN 325 MG PO TABS
650.0000 mg | ORAL_TABLET | ORAL | 0 refills | Status: AC | PRN
  Filled 2021-11-08: qty 30, 3d supply, fill #0

## 2021-11-08 MED ORDER — AMOXICILLIN-POT CLAVULANATE 875-125 MG PO TABS
1.0000 | ORAL_TABLET | Freq: Two times a day (BID) | ORAL | 0 refills | Status: AC
  Filled 2021-11-08: qty 14, 7d supply, fill #0

## 2021-11-08 NOTE — Progress Notes (Signed)
   11/08/21 1517  Departure Condition  Departure Condition Good  Mobility at [facility]  Patient/Caregiver Teaching Teach Back Method Used;Discharge instructions reviewed;Prescriptions reviewed;Follow-up care reviewed;Pain management discussed;Medications discussed;Patient/caregiver verbalized understanding  Departure Mode By self  Was procedural sedation performed on this patient during this visit? No   Discharged at 1300. Patient alert and oriented x4, VS and pain stable. Note left for birth registry to call pt regarding changes on worksheet and that patient was discharged. RN also gave pt birth registry phone number to call in the morning.

## 2021-11-08 NOTE — Clinical Social Work Maternal (Signed)
CLINICAL SOCIAL WORK MATERNAL/CHILD NOTE  Patient Details  Name: Mary Levine MRN: [id number] Date of Birth: 03-07-1986  Date:  11/08/2021  Clinical Social Worker Initiating Note:  Abundio Miu, [place] Date/Time: Initiated:  11/08/21/1218     Child's Name:  Zara Chess   Biological Parents:  Mother, Father (Father: Joanie Coddington)   Need for Interpreter:  Spanish   Reason for Referral:  Parental Support of Premature Babies < 33 weeks/or Critically Ill babies, Other (Comment) (Limited Prenatal Care)   Address:  [place] [address]    Phone number:  [phone] (home)     Additional phone number:   Household Members/Support Persons (HM/SP):   Household Member/Support Person 1, Household Member/Support Person 2, Household Member/Support Person 3, Household Member/Support Person 4   HM/SP Name Relationship DOB or Age  HM/SP -1   mother    HM/SP -2 [place] daughter 07/21/08  HM/SP -3 Selene Adela Glimpse daughter 06/25/10  HM/SP -4 Jocelyn Patsey Berthold daughter 07/07/11  HM/SP -5        HM/SP -6        HM/SP -7        HM/SP -8          Natural Supports (not living in the home):      Professional Supports: None   Employment: Unemployed   Type of Work:     Education:  9 to 11 years (9th Grade)   Homebound arranged: No  Financial Resources:  Self-Pay     Other Resources:  [employer], Physicist, medical     Cultural/Religious Considerations Which May Impact Care:    Strengths:  Ability to meet basic needs  , Understanding of illness   Psychotropic Medications:         Pediatrician:       Pediatrician List: List Provided; No selection made at this time  [facility]      Pediatrician Fax Number:    Risk Factors/Current Problems:  None   Cognitive State:  Able to Concentrate  , Alert  , Goal Oriented  , Linear Thinking      Mood/Affect:  Comfortable  , Interested  , Tearful  , Calm     CSW Assessment: CSW met with MOB at infant's bedside to complete psychosocial assessment. CSW utilized [employer] spanish video interpreter Barnie Alderman [phone]). CSW introduced self and explained role. MOB was welcoming, pleasant, and remained engaged during assessment. MOB reported that she resides with her mother and three older daughters. MOB reported that she is unemployed and receives both [employer] and food stamps. MOB reported that she has not started to shop for infant and shared that the health department reported that they would assist her with getting a car seat. CSW informed MOB about [employer]'s [employer] if any assistance is needed obtaining items for infant. MOB reported that a referral for all essential items and a pack and play would be helpful, CSW agreed to make a referral. CSW inquired about MOB's support system, MOB reported that her mom is a support.   CSW inquired about MOB's mental health history. MOB denied any mental health history and denied any history of postpartum depression. CSW inquired about how MOB was feeling emotionally since giving birth, MOB reported that she was feeling sad due to  infant's NICU admission. CSW acknowledged, validated, and normalized MOB's feelings. MOB became tearful when speaking about infant's NICU admission. CSW and MOB discussed emotions that can arise from a NICU admission and finding balance between home and the NICU. MOB presented calm and did not demonstrate any acute mental health signs/symptoms. CSW assessed for safety, MOB denied SI, HI, and domestic violence.   CSW provided education regarding the baby blues period vs. perinatal mood disorders, discussed treatment and gave resources for mental health follow up if concerns arise.  CSW recommends self-evaluation during the postpartum time period using the New Mom Checklist from Postpartum Progress  and encouraged MOB to contact a medical professional if symptoms are noted at any time.    CSW provided review of Sudden Infant Death Syndrome (SIDS) precautions.    CSW and MOB discussed infant's NICU admission. CSW informed MOB about the NICU, what to expect, and resources/supports available while infant is admitted to the NICU. MOB reported that she feels well informed about infant's care. MOB denied any transportation barriers with visiting infant in the NICU. MOB reported that meal vouchers would be helpful, CSW provided MOB with 4 meal vouchers. MOB denied any questions/concerns regarding the NICU.   CSW informed MOB about the hospital drug screen policy due to limited prenatal care. MOB confirmed having only 3 visits. CSW inquired about barriers to prenatal care aside from infant being born early. MOB shared about being unemployed and family issues. MOB reported that she also joined the adopt a mom program which made it harder to get care. MOB denied any issues with getting infant to the pediatrician as needed post discharge. CSW inquired about any substance use during pregnancy, MOB denied any substance use. CSW informed MOB that infant's UDS and CDS would be monitored and a CPS report would be made if warranted. MOB verbalized understanding and denied any questions.   CSW made FSN referral for requested items.   CSW will continue to offer resources/supports while infant is admitted to the NICU. MOB opted for CSW to check in weekly.    CSW Plan/Description:  Psychosocial Support and Ongoing Assessment of Needs, Sudden Infant Death Syndrome (SIDS) Education, Perinatal Mood and Anxiety Disorder (PMADs) Education, [place], CSW Will Continue to Monitor Umbilical Cord Tissue Drug Screen Results and Make Report if Warranted, Other Information/Referral to [employer], [place] 11/08/2021, 12:22 PM

## 2021-11-08 NOTE — Lactation Note (Signed)
This note was copied from a baby's chart.  NICU Lactation Consultation Note  Patient Name: Mary Levine Date: 11/08/2021 Age:36 hours   Subjective Reason for consult: Follow-up assessment; NICU baby; Preterm <34wks  Lactation followed up with Birth Mother on the NICU floor. Interpretor: Therapist, nutritional (iPad). Parent states that she is not seeing any milk express with pumping at this stage. I provided reassurance regarding milk volume in days 1-4, and encouraged her to pump q3 hours. She states that she understands how to use the breast pump and that she has been shown the settings. I was unable to visualize her pump at this visit.  Parent does not own a personal pump. LC will investigate if there are any options to obtain a pump for home usage.  Objective Infant data: Mother's Current Feeding Choice: Breast Milk and Donor Milk    Maternal data: [id number]  Vaginal, Spontaneous Current breast feeding challenges:: NICU  Pumping frequency: recommended q3 hours Pumped volume: 0 mL   Pump:  (None)  Assessment  Maternal: Milk volume: Normal   Intervention/Plan Interventions: Breast feeding basics reviewed; Education  Pump Education: Setup, frequency, and cleaning  Plan: Consult Status: NICU follow-up  NICU Follow-up type: New admission follow up; Verify onset of copious milk    Lenore Manner 11/08/2021, 12:47 PM

## 2021-11-08 NOTE — Plan of Care (Signed)
  Problem: Education: Goal: Knowledge of condition will improve Outcome: Adequate for Discharge Goal: Individualized Educational Video(s) Outcome: Adequate for Discharge Goal: Individualized Newborn Educational Video(s) Outcome: Adequate for Discharge   Problem: Activity: Goal: Will verbalize the importance of balancing activity with adequate rest periods Outcome: Adequate for Discharge Goal: Ability to tolerate increased activity will improve Outcome: Adequate for Discharge   Problem: Coping: Goal: Ability to identify and utilize available resources and services will improve Outcome: Adequate for Discharge   Problem: Life Cycle: Goal: Chance of risk for complications during the postpartum period will decrease Outcome: Adequate for Discharge   Problem: Role Relationship: Goal: Ability to demonstrate positive interaction with newborn will improve Outcome: Adequate for Discharge   Problem: Skin Integrity: Goal: Demonstration of wound healing without infection will improve Outcome: Adequate for Discharge   Problem: Education: Goal: Knowledge of General Education information will improve Description: Including pain rating scale, medication(s)/side effects and non-pharmacologic comfort measures Outcome: Adequate for Discharge   Problem: Health Behavior/Discharge Planning: Goal: Ability to manage health-related needs will improve Outcome: Adequate for Discharge   Problem: Clinical Measurements: Goal: Ability to maintain clinical measurements within normal limits will improve Outcome: Adequate for Discharge Goal: Will remain free from infection Outcome: Adequate for Discharge Goal: Diagnostic test results will improve Outcome: Adequate for Discharge Goal: Respiratory complications will improve Outcome: Adequate for Discharge Goal: Cardiovascular complication will be avoided Outcome: Adequate for Discharge   Problem: Activity: Goal: Risk for activity intolerance will  decrease Outcome: Adequate for Discharge   Problem: Nutrition: Goal: Adequate nutrition will be maintained Outcome: Adequate for Discharge   Problem: Coping: Goal: Level of anxiety will decrease Outcome: Adequate for Discharge   Problem: Elimination: Goal: Will not experience complications related to bowel motility Outcome: Adequate for Discharge Goal: Will not experience complications related to urinary retention Outcome: Adequate for Discharge   Problem: Pain Managment: Goal: General experience of comfort will improve Outcome: Adequate for Discharge   Problem: Safety: Goal: Ability to remain free from injury will improve Outcome: Adequate for Discharge   Problem: Skin Integrity: Goal: Risk for impaired skin integrity will decrease Outcome: Adequate for Discharge

## 2021-11-11 ENCOUNTER — Ambulatory Visit: Payer: Self-pay

## 2021-11-11 LAB — CULTURE, BLOOD (ROUTINE X 2)
Culture: NO GROWTH
Culture: NO GROWTH

## 2021-11-11 NOTE — Lactation Note (Signed)
This note was copied from a baby's chart.  NICU Lactation Consultation Note  Patient Name: Mary Levine [id number] Date: 11/11/2021 Age:36 days   Subjective Reason for consult: Follow-up assessment; Mother's request; RN request; NICU baby; Preterm <34wks RN called lactation to room to assist with management of engorgement. I showed parent how to use breast pump, how to do lymphatic massage, and provided ice. Recommended that she take the ibuprofen her provider prescribed.  Breasts are red and swollen. I provided ice and recommended icing for 20 minutes and then pumping again.  Parent has spoken with [facility] but not made an appointment to pick up a breast pump. She states that she can do this today. I called WIC after our appointment and left a voicemail that this patient needs a breast pump ASAP. I also encouraged parent to call [facility],  Parent has a manual pump at home, but feels is it insufficient. Lactation to follow up on Sunday.  Objective Infant data: Mother's Current Feeding Choice: Breast Milk and Donor Milk  Maternal data: [id number]  Vaginal, Spontaneous Current breast feeding challenges:: NICU; infant separation  Does the patient have breastfeeding experience prior to this delivery?: Yes  Pumping frequency: recommended q3 hours; has not been pumping consistently, and does not have an electric pump at home Pumped volume: 30 mL   WIC Program: Yes WIC Referral Sent?: Yes Pump:  (advised to make appointment with [facility])  Assessment  Maternal: Milk volume: Normal   Intervention/Plan Interventions: Breast feeding basics reviewed; Ice; Education  Tools: Pump Pump Education: Setup, frequency, and cleaning  Plan: Consult Status: NICU follow-up  No data recorded   Lenore Manner 11/11/2021, 12:16 PM

## 2021-11-13 ENCOUNTER — Ambulatory Visit: Payer: Self-pay

## 2021-11-13 NOTE — Lactation Note (Signed)
This note was copied from a baby's chart.  NICU Lactation Consultation Note  Patient Name: Mary Levine [id number] Date: 11/13/2021 Age:36 days   Subjective Reason for consult: Follow-up assessment; NICU baby; Preterm <34wks  Spanish interpretor ([facility]) assisted. I followed up with parent of patient. She reports that she was able to obtain her pump from [facility] on 8/25 in the afternoon. She is under-pumping, and this is due to a perception that it's not productive (because she is pumping one ounce) and due to scheduling issues with her three children at home.  I provided education on how frequency of pumping will assist increase in milk volume. Parent states that her kids return to school on 8/28, and she is hopeful that she can increase her pumping sessions then.  Objective Infant data: Mother's Current Feeding Choice: Breast Milk and Donor Milk  Infant feeding assessment Scale for Readiness: 3  Maternal data: [id number]  Vaginal, Spontaneous  Current breast feeding challenges:: NICU; separation  Pumping frequency: 3 times/day Pumped volume: 30 mL   WIC Program: Yes WIC Referral Sent?: Yes Pump:  (WIC pump - obtained on Friday 8/25)  Assessment  Maternal: Milk volume: Low   Intervention/Plan Interventions: Breast feeding basics reviewed; Education  Tools: Pump Pump Education: Setup, frequency, and cleaning  Plan: Consult Status: NICU follow-up  NICU Follow-up type: Weekly NICU follow up    Lenore Manner 11/13/2021, 11:25 AM

## 2021-11-15 ENCOUNTER — Telehealth (HOSPITAL_COMMUNITY): Payer: Self-pay | Admitting: *Deleted

## 2021-11-15 NOTE — Telephone Encounter (Signed)
Attempted Hospital Discharge Follow-up Call with help of telephonic interpreter "Maudry Mayhew [phone]".  No answer.  Unable to leave a message.

## 2021-11-21 ENCOUNTER — Ambulatory Visit: Payer: Self-pay

## 2021-11-21 NOTE — Lactation Note (Signed)
This note was copied from a baby's chart.  NICU Lactation Consultation Note  Patient Name: Mary Levine [id number] Date: 11/21/2021 Age:36 wk.o.   Subjective Reason for consult: Follow-up assessment; NICU baby; Preterm <34wks  InterpretorClifton James - [phone] - Lactation followed up with [name]. She now has a WIC pump to use at home. She is currently pumping approximately 5 times a day and is obtaining about 30 mls/session.   Lactation reviewed pumping recommendations based on volume goals. She states that baby is beginning to cue, and we discussed the role of lick and learn. Parent is interested in putting baby to breast for practice.   Lactation to follow up.  Objective Infant data: Mother's Current Feeding Choice: Breast Milk and Donor Milk  Infant feeding assessment Scale for Readiness: 3  Maternal data: [id number]   Current breast feeding challenges:: NICU  WIC Program: Yes WIC Referral Sent?: Yes Pump:  (WIC pump)  Intervention/Plan Interventions: Breast feeding basics reviewed; Education  No data recorded Plan: Consult Status: NICU follow-up  NICU Follow-up type: Weekly NICU follow up; Assist with IDF-1 (Mother to pre-pump before breastfeeding)    Lenore Manner 11/21/2021, 1:04 PM

## 2021-11-28 ENCOUNTER — Ambulatory Visit: Payer: Self-pay

## 2021-11-28 NOTE — Lactation Note (Signed)
This note was copied from a baby's chart.  NICU Lactation Consultation Note  Patient Name: Mary Levine Date: 11/28/2021 Age:36 wk.o.  Subjective Reason for consult: Follow-up assessment; NICU baby; Infant < 6lbs; Preterm <34wks  Visited with family of 20 23/36 weeks old (adjusted) NICU female, Mary Levine is a P4 and reports she's pumping but not consistently. Explained to MOB the importance of consistent pumping to protect her supply, it continues to dwindle; she voiced that it's been challenging to find time in her schedule because she has take care of her other kids. She normally goes 10.5 hours without pumping at night. Reviewed some strategies to protect her supply, lactogenesis III, feeding cues and IDF 1/2.  Objective Infant data: Mother's Current Feeding Choice: Breast Milk and Donor Milk  Infant feeding assessment Scale for Readiness: 3  Maternal data: [id number]  Vaginal, Spontaneous Pumping frequency: 5 times/24 hours Pumped volume: 10 mL (10-30 ml (up to 30 ml in the AM)) Flange Size: 27 Risk factor for low milk supply:: infrequent pumping, infant separation WIC Program: Yes WIC Referral Sent?: Yes Pump:  (WIC pump)  Assessment Infant: In NICU  Maternal: Milk volume: Low  Intervention/Plan Interventions: Breast feeding basics reviewed; DEBP; Education; Infant Driven Feeding Algorithm education Tools: Pump; Flanges Pump Education: Setup, frequency, and cleaning; Milk Storage  Plan of care: Encouraged pumping every 3 hours or as close as she can get to that schedule She'll start power pumping in the AM She'll call for assistance when it's time for baby to go to breast  FOB present and supportive. All questions and concerns answered, family to contact [facility] services PRN.  Consult Status: NICU follow-up  NICU Follow-up type: Weekly NICU follow up; Assist with IDF-1 (Mother to pre-pump before breastfeeding)   Mary Levine 11/28/2021, 12:31  PM

## 2021-12-06 ENCOUNTER — Ambulatory Visit: Payer: Self-pay

## 2021-12-06 NOTE — Lactation Note (Signed)
This note was copied from a baby's chart.  NICU Lactation Consultation Note  Patient Name: Mary Levine [id number] Date: 12/06/2021 Age:36 wk.o.  Subjective Reason for consult: Follow-up assessment; NICU baby; Infant < 6lbs; Preterm <34wks  Visited with family of 14 48/55 weeks old (adjusted) NICU female, Mary Levine is a P4 and reports she continues to pump (not consistently through), her volumes continue to be BNL. She voiced she tried power pumping last week but it didn't increase her supply; noticed the same pumping pattern from last week on last [place] visit; explained that her supply will continue to dwindle if pumping frequency is not increased, she voiced understanding and said that is what happened with her other kids. She did noticed though that her supply increases when she's at the hospital with baby. Reviewed supply/demand, benefits of STS care, feeding readiness and IDF 1/2.  Objective Infant data: Mother's Current Feeding Choice: Breast Milk and Formula  Infant feeding assessment Scale for Readiness: 2  Maternal data: [id number]  Vaginal, Spontaneous Pumping frequency: 5 times/24 hours Pumped volume: 10 mL Flange Size: 27 WIC Program: Yes WIC Referral Sent?: Yes Pump: Personal (WIC pump)  Assessment Infant: In NICU  Maternal: Milk volume: Low  Intervention/Plan Interventions: Breast feeding basics reviewed; DEBP; Education; Infant Driven Feeding Algorithm education Tools: Pump; Flanges Pump Education: Setup, frequency, and cleaning; Milk Storage  Plan of care: Encouraged pumping every 3 hours or as close as she can get to that schedule STS care was also strongly encouraged She'll call for assistance when it's time for baby to go to breast, no need to pre-pump due to maternal supply   No other support person at this time. All questions and concerns answered, family to contact [facility] services PRN.  Consult Status: NICU follow-up   [place] 12/06/2021, 11:00 AM

## 2021-12-21 ENCOUNTER — Ambulatory Visit: Payer: Self-pay

## 2021-12-21 NOTE — Lactation Note (Addendum)
This note was copied from a baby's chart.  NICU Lactation Consultation Note  Patient Name: Girl Geneve Jyllian Haynie [id number] Date: 12/21/2021 Age:36 wk.o.   Subjective Reason for consult: Follow-up assessment; NICU baby  Interpretor: Mickel Baas [phone]  Lactation conducted discharge visit with Ms. Davy Faught. She has been feeding baby Valerie by bottle and providing formula and EBM. Her milk volume is BNL; she has needed supplies including a pump from [facility] for home use. I provided her with outpatient resources including our NICU warm line and OP Lactation Consultant information.    Objective Infant data: Mother's Current Feeding Choice: Breast Milk and Formula  Infant feeding assessment Scale for Readiness: 1 Scale for Quality: 3  Maternal data: [id number]  Vaginal, Spontaneous  Pumping frequency: inconsistent Pumped volume: 10 mL   WIC Program: Yes WIC Referral Sent?: Yes Pump:  (WIC Pump)  Assessment Infant: No data recorded Feeding Status: Ad lib   Maternal: Milk volume: Low   Intervention/Plan Interventions: Education; [employer] brochure  Pump Education: Setup, frequency, and cleaning  Plan: Consult Status: Complete  No data recorded   Lenore Manner 12/21/2021, 12:49 PM
